# Patient Record
Sex: Female | Born: 1995 | Race: Black or African American | Hispanic: No | Marital: Single | Smoking: Current some day smoker
Health system: Southern US, Community
[De-identification: ages and names within clinical notes are randomized; demographics above are authoritative.]

## PROBLEM LIST (undated history)

## (undated) ENCOUNTER — Ambulatory Visit (HOSPITAL_COMMUNITY): Admission: EM | Payer: Self-pay | Source: Home / Self Care

## (undated) DIAGNOSIS — Z789 Other specified health status: Secondary | ICD-10-CM

## (undated) HISTORY — PX: NO PAST SURGERIES: SHX2092

---
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
## 2013-05-11 ENCOUNTER — Ambulatory Visit (INDEPENDENT_AMBULATORY_CARE_PROVIDER_SITE_OTHER): Payer: PRIVATE HEALTH INSURANCE | Admitting: Advanced Practice Midwife

## 2013-05-11 ENCOUNTER — Encounter: Payer: Self-pay | Admitting: Advanced Practice Midwife

## 2013-05-11 VITALS — BP 111/69 | HR 80 | Temp 98.6°F | Ht 64.0 in | Wt 170.0 lb

## 2013-05-11 DIAGNOSIS — IMO0001 Reserved for inherently not codable concepts without codable children: Secondary | ICD-10-CM

## 2013-05-11 DIAGNOSIS — Z01419 Encounter for gynecological examination (general) (routine) without abnormal findings: Secondary | ICD-10-CM

## 2013-05-11 DIAGNOSIS — Z113 Encounter for screening for infections with a predominantly sexual mode of transmission: Secondary | ICD-10-CM

## 2013-05-11 LAB — RPR

## 2013-05-11 LAB — TSH: TSH: 3.292 u[IU]/mL (ref 0.350–4.500)

## 2013-05-11 MED ORDER — NORELGESTROMIN-ETH ESTRADIOL 150-35 MCG/24HR TD PTWK: 1.0000 | MEDICATED_PATCH | TRANSDERMAL | Status: DC

## 2013-05-11 NOTE — Progress Notes (Signed)
Patient in office today for an annual exam. Patient states she would like blood work for STD. Patient states period is irregular she never knows when its going to start or stop. Patient states that last month her period lasted for the whole month. Patient would like to talk about birth control.

## 2013-05-11 NOTE — Progress Notes (Signed)
  Subjective:     Stacey Hines is a 18 y.o. female and is here for a comprehensive physical exam. The patient reports problems - abnormal uterine bleeding. Patient reports she has abnormal bleeding, had bleeding all last month. She was on ortho evra for AUB before and would like to go back on this. She denies risk of blood clot, liver disease. Desires STI screening.   Patient currently having heavy uterine bleeding. Female partner.   History   Social History  . Marital Status: Single    Spouse Name: N/A    Number of Children: N/A  . Years of Education: N/A   Occupational History  . Not on file.   Social History Main Topics  . Smoking status: Never Smoker   . Smokeless tobacco: Never Used  . Alcohol Use: No  . Drug Use: No  . Sexual Activity: Yes    Partners: Female   Other Topics Concern  . Not on file   Social History Narrative  . No narrative on file   No health maintenance topics applied.  The following portions of the patient's history were reviewed and updated as appropriate: allergies, current medications, past family history, past medical history, past social history, past surgical history and problem list.  Review of Systems A comprehensive review of systems was negative.   Objective:    BP 111/69  Pulse 80  Temp(Src) 98.6 F (37 C)  Ht 5\' 4"  (1.626 m)  Wt 170 lb (77.111 kg)  BMI 29.17 kg/m2  LMP 05/06/2013 General appearance: alert and cooperative Head: Normocephalic, without obvious abnormality, atraumatic Eyes: conjunctivae/corneas clear. PERRL, EOM's intact. Fundi benign. Nose: Nares normal. Septum midline. Mucosa normal. No drainage or sinus tenderness. Throat: lips, mucosa, and tongue normal; teeth and gums normal Neck: no adenopathy, no carotid bruit, no JVD, supple, symmetrical, trachea midline and thyroid not enlarged, symmetric, no tenderness/mass/nodules Back: symmetric, no curvature. ROM normal. No CVA tenderness. Lungs: clear to  auscultation bilaterally Breasts: normal appearance, no masses or tenderness Heart: regular rate and rhythm, S1, S2 normal, no murmur, click, rub or gallop Abdomen: soft, non-tender; bowel sounds normal; no masses,  no organomegaly Extremities: extremities normal, atraumatic, no cyanosis or edema Lymph nodes: Cervical, supraclavicular, and axillary nodes normal. Neurologic: Grossly normal    Assessment:    Healthy female exam.  Abnormal Uterine Bleeding Desires STI screening     Plan:     See After Visit Summary for Counseling Recommendations  STI screening today. Urine GC/CT.  Ortho Evra for contraception. Reviewed all options w/ patient, she declined. Reviewed risk and benefits of ortho evra. TSH done and pending  40 min spent with patient greater than 80% spent in counseling and coordination of care.   Eliese Kerwood Wilson SingerWren CNM

## 2013-05-12 LAB — HEPATITIS C ANTIBODY: HCV AB: NEGATIVE

## 2013-05-12 LAB — HEPATITIS B SURFACE ANTIGEN: Hepatitis B Surface Ag: NEGATIVE

## 2013-05-12 LAB — HIV ANTIBODY (ROUTINE TESTING W REFLEX): HIV: NONREACTIVE

## 2013-05-12 LAB — GC/CHLAMYDIA PROBE AMP
CT Probe RNA: NEGATIVE
GC Probe RNA: NEGATIVE

## 2013-05-18 ENCOUNTER — Encounter: Payer: Self-pay | Admitting: Advanced Practice Midwife

## 2013-06-20 ENCOUNTER — Ambulatory Visit: Payer: PRIVATE HEALTH INSURANCE | Admitting: Obstetrics

## 2013-06-22 ENCOUNTER — Ambulatory Visit: Payer: PRIVATE HEALTH INSURANCE | Admitting: Advanced Practice Midwife

## 2013-06-26 ENCOUNTER — Encounter: Payer: Self-pay | Admitting: Obstetrics

## 2013-06-26 ENCOUNTER — Ambulatory Visit (INDEPENDENT_AMBULATORY_CARE_PROVIDER_SITE_OTHER): Payer: PRIVATE HEALTH INSURANCE | Admitting: Obstetrics

## 2013-06-26 VITALS — BP 116/73 | HR 92 | Temp 99.0°F | Ht 64.0 in | Wt 169.0 lb

## 2013-06-26 DIAGNOSIS — Z113 Encounter for screening for infections with a predominantly sexual mode of transmission: Secondary | ICD-10-CM

## 2013-06-26 DIAGNOSIS — Z3009 Encounter for other general counseling and advice on contraception: Secondary | ICD-10-CM

## 2013-06-26 DIAGNOSIS — N760 Acute vaginitis: Secondary | ICD-10-CM

## 2013-06-26 NOTE — Progress Notes (Signed)
Subjective:     Stacey Hines is a [age] y.o. female here for a routine exam.  Current complaints: Patient states she is in office today for a pain in her lower left side that wraps around to her back. Pt states it is a sharp shooting pain that comes and goes. Pt states when she sleeps on her left side or applies pressure it hurts worse. Pt states the pain has been going on for 1 week. Pt states her significant other was diagnosed with PID about a month ago. Pt denies any information regarding her significant other's test results.   Personal health questionnaire reviewed: yes.   Gynecologic History Patient's last menstrual period was 05/11/2013. Contraception: none  Obstetric History OB History  Gravida Para Term Preterm AB SAB TAB Ectopic Multiple Living  0 0 0 0 0 0 0 0 0 0          The following portions of the patient's history were reviewed and updated as appropriate: allergies, current medications, past family history, past medical history, past social history, past surgical history and problem list.  Review of Systems Pertinent items are noted in HPI.    Objective:    General appearance: alert and no distress Abdomen: normal findings: soft, non-tender Pelvic: cervix normal in appearance, external genitalia normal, no adnexal masses or tenderness, no cervical motion tenderness, rectovaginal septum normal, uterus normal size, shape, and consistency and vagina normal without discharge    Assessment:    Healthy female exam.   Counseling for contraception.   Plan:    Education reviewed: safe sex/STD prevention. Contraception: OCP (estrogen/progesterone). Follow up in: several months. Continue PNV's.

## 2013-06-27 ENCOUNTER — Encounter: Payer: Self-pay | Admitting: Obstetrics

## 2013-06-27 DIAGNOSIS — N760 Acute vaginitis: Secondary | ICD-10-CM | POA: Insufficient documentation

## 2013-06-27 LAB — GC/CHLAMYDIA PROBE AMP
CT PROBE, AMP APTIMA: NEGATIVE
GC Probe RNA: NEGATIVE

## 2013-06-27 LAB — WET PREP BY MOLECULAR PROBE
CANDIDA SPECIES: NEGATIVE
Gardnerella vaginalis: NEGATIVE
TRICHOMONAS VAG: NEGATIVE

## 2013-06-29 ENCOUNTER — Encounter: Payer: Self-pay | Admitting: Obstetrics

## 2013-06-29 ENCOUNTER — Ambulatory Visit (INDEPENDENT_AMBULATORY_CARE_PROVIDER_SITE_OTHER): Payer: PRIVATE HEALTH INSURANCE | Admitting: Obstetrics

## 2013-06-29 VITALS — BP 110/74 | HR 80 | Temp 98.0°F | Ht 64.0 in | Wt 169.0 lb

## 2013-06-29 DIAGNOSIS — N739 Female pelvic inflammatory disease, unspecified: Secondary | ICD-10-CM

## 2013-06-29 DIAGNOSIS — Z01419 Encounter for gynecological examination (general) (routine) without abnormal findings: Secondary | ICD-10-CM

## 2013-06-29 NOTE — Progress Notes (Signed)
Subjective:     Stacey Hines is a 18 y.o. female here for a routine exam.  Current complaints: Patient is in the office today for a follow up for PID. Patient denies any concerns.  Personal health questionnaire reviewed: yes.   Gynecologic History Patient's last menstrual period was 05/11/2013. Contraception: none  Obstetric History OB History  Gravida Para Term Preterm AB SAB TAB Ectopic Multiple Living  0 0 0 0 0 0 0 0 0 0          The following portions of the patient's history were reviewed and updated as appropriate: allergies, current medications, past family history, past medical history, past social history, past surgical history and problem list.  Review of Systems Pertinent items are noted in HPI.    Objective:    General appearance: alert and no distress Abdomen: normal findings: soft, non-tender Pelvic: NEFG.  Vagina normal.  Uterus NSSC, NT.  Adnexa negative  Assessment:    Healthy female exam.   ? H/O PID.   Plan:    Annual exam next visit.

## 2013-09-21 ENCOUNTER — Encounter (HOSPITAL_COMMUNITY): Payer: Self-pay | Admitting: Emergency Medicine

## 2013-09-21 ENCOUNTER — Emergency Department (HOSPITAL_COMMUNITY)
Admission: EM | Admit: 2013-09-21 | Discharge: 2013-09-21 | Disposition: A | Discharge status: Home / Self Care | Payer: Medicaid Other | Attending: Emergency Medicine | Admitting: Emergency Medicine

## 2013-09-21 ENCOUNTER — Emergency Department (HOSPITAL_COMMUNITY): Payer: Medicaid Other

## 2013-09-21 DIAGNOSIS — S59919A Unspecified injury of unspecified forearm, initial encounter: Principal | ICD-10-CM

## 2013-09-21 DIAGNOSIS — Y9289 Other specified places as the place of occurrence of the external cause: Secondary | ICD-10-CM | POA: Insufficient documentation

## 2013-09-21 DIAGNOSIS — S6990XA Unspecified injury of unspecified wrist, hand and finger(s), initial encounter: Principal | ICD-10-CM

## 2013-09-21 DIAGNOSIS — S6992XA Unspecified injury of left wrist, hand and finger(s), initial encounter: Secondary | ICD-10-CM

## 2013-09-21 DIAGNOSIS — S59909A Unspecified injury of unspecified elbow, initial encounter: Secondary | ICD-10-CM | POA: Insufficient documentation

## 2013-09-21 DIAGNOSIS — Y9389 Activity, other specified: Secondary | ICD-10-CM | POA: Insufficient documentation

## 2013-09-21 DIAGNOSIS — W010XXA Fall on same level from slipping, tripping and stumbling without subsequent striking against object, initial encounter: Secondary | ICD-10-CM | POA: Insufficient documentation

## 2013-09-21 MED ORDER — TRAMADOL HCL 50 MG PO TABS: 50.0000 mg | ORAL_TABLET | Freq: Four times a day (QID) | ORAL | Status: DC | PRN

## 2013-09-21 NOTE — ED Notes (Signed)
She slipped and fell onto L arm 2 days ago and having L wrist and hand pain since. She applied ace wrap with no relief. Cms intact

## 2013-09-21 NOTE — Discharge Instructions (Signed)
Followup with orthopedics as directed for your injury. Take tramadol as directed as needed for pain.

## 2013-09-21 NOTE — ED Notes (Signed)
Ortho notified

## 2013-09-21 NOTE — ED Notes (Signed)
Registration at bedside.

## 2013-09-21 NOTE — ED Notes (Signed)
Ortho at bedside.

## 2013-09-21 NOTE — Progress Notes (Signed)
Orthopedic Tech Progress Note Patient Details:  Stacey Hines 13-Dec-1995 [id number] APPLIED VELCRO THUMB SPICA SPLINT TO LUE. CAPILLARY REFILL LESS THAN 2 SECONDS BEFORE AND AFTER SPLINTING. Ortho Devices Type of Ortho Device: Thumb spica splint Splint Material: Other (comment) Ortho Device/Splint Location: LUE Ortho Device/Splint Interventions: Application   Lesle ChrisGilliland, Roy L 09/21/2013, 12:12 PM

## 2013-09-21 NOTE — ED Provider Notes (Signed)
CSN: [id number]     Arrival date & time 09/21/13  1046 History  This chart was scribed for non-physician practitioner, Johnnette Gourdobyn Albert, PA-C, working with Rolland PorterMark James, MD, by Jarvis Morganaylor Ferguson, ED Scribe. This patient was seen in room TR07C/TR07C and the patient's care was started at 11:44 AM.    Chief Complaint  Patient presents with  . Arm Injury     The history is provided by the patient. No language interpreter was used.   HPI Comments: Stacey Hines is a 18 y.o. female who presents to the Emergency Department complaining of an injury to her left arm that occurred Wednesday. She states that pain is localized in her left wrist and left hand. She states that pain is currently a "6/10" but with movement the pain increases to a "9/10". Patient states that she injured herself when she was mopping at the floor at her work on Wednesday and she slipped and fell on her left arm. Patient has not taken anything for the pain. She states that the pain does not radiate up her arm. She denies any elbow or shoulder pain.     History reviewed. No pertinent past medical history. History reviewed. No pertinent past surgical history. History reviewed. No pertinent family history. History  Substance Use Topics  . Smoking status: Never Smoker   . Smokeless tobacco: Never Used  . Alcohol Use: Yes     Comment: Ocassional   OB History   Grav Para Term Preterm Abortions TAB SAB Ect Mult Living   0 0 0 0 0 0 0 0 0 0      Review of Systems  Musculoskeletal: Positive for arthralgias (left wrist pain).  A complete 10 system review of systems was obtained and all systems are negative except as noted in the HPI and PMH.       Allergies  Review of patient's allergies indicates no known allergies.  Home Medications   Prior to Admission medications   Medication Sig Start Date End Date Taking? Authorizing Provider  traMADol (ULTRAM) 50 MG tablet Take 1 tablet (50 mg total) by mouth every 6 (six) hours  as needed. 09/21/13   Trevor Maceobyn M Albert, PA-C   Triage Vitals: Pulse 72  Temp(Src) 99 F (37.2 C) (Oral)  Resp 16  Ht 5\' 4"  (1.626 m)  Wt 155 lb (70.308 kg)  BMI 26.59 kg/m2  SpO2 100%  LMP 09/20/2013  Physical Exam  Nursing note and vitals reviewed. Constitutional: She is oriented to person, place, and time. She appears well-developed and well-nourished. No distress.  HENT:  Head: Normocephalic and atraumatic.  Mouth/Throat: Oropharynx is clear and moist.  Eyes: Conjunctivae and EOM are normal.  Neck: Normal range of motion. Neck supple.  Cardiovascular: Normal rate, regular rhythm and normal heart sounds.   Pulmonary/Chest: Effort normal and breath sounds normal. No respiratory distress.  Musculoskeletal: Normal range of motion. She exhibits no edema.  Left wrist: Tender to palpation over anatomical snuff box and 1st metacarpal, no swelling, full ROM, pain noted in all directions. +2 radial pulses   Neurological: She is alert and oriented to person, place, and time. No sensory deficit.  Skin: Skin is warm and dry.  Psychiatric: She has a normal mood and affect. Her behavior is normal.    ED Course  Procedures (including critical care time) SPLINT APPLICATION Date/Time: 11:51 AM Authorized by: Johnnette GourdAlbert, Robyn Consent: Verbal consent obtained. Risks and benefits: risks, benefits and alternatives were discussed Consent given by: patient Splint applied by:  orthopedic technician Location details: left wrist Splint type: thumb spica Supplies used: velcro Post-procedure: The splinted body part was neurovascularly unchanged following the procedure. Patient tolerance: Patient tolerated the procedure well with no immediate complications.     DIAGNOSTIC STUDIES: Oxygen Saturation is 100% on RA, normal by my interpretation.    COORDINATION OF CARE: 11: 06 AM- Will order diagnostic imaging or left hand and wrist. Pt advised of plan for treatment and pt agrees.  11:50 AM- Will  order Ultram injection and splint of left wrist. Pt advised of plan for treatment and pt agrees.     Labs Review Labs Reviewed - No data to display  Imaging Review Dg Wrist Complete Left  09/21/2013   CLINICAL DATA:  Injury.  Fall.  Wrist and hand pain.  EXAM: LEFT WRIST - COMPLETE 3+ VIEW  COMPARISON:  Hand radiographs from 09/21/2013  FINDINGS: There is no evidence of fracture or dislocation. There is no evidence of arthropathy or other focal bone abnormality. Soft tissues are unremarkable.  IMPRESSION: 1. No significant abnormality identified. If the patient's tenderness is in the vicinity of the scaphoid/anatomic snuffbox, then cross-sectional imaging or presumptive treatment for occult scaphoid fracture might be considered.   Electronically Signed   By: Herbie BaltimoreWalt  Liebkemann M.D.   On: 09/21/2013 11:35   Dg Hand Complete Left  09/21/2013   CLINICAL DATA:  Fall.  Left hand injury and pain.  EXAM: LEFT HAND - COMPLETE 3+ VIEW  COMPARISON:  None.  FINDINGS: There is no evidence of fracture or dislocation. There is no evidence of arthropathy or other focal bone abnormality. Soft tissues are unremarkable.  IMPRESSION: Negative.   Electronically Signed   By: Myles RosenthalJohn  Stahl M.D.   On: 09/21/2013 11:33     EKG Interpretation None      MDM   Final diagnoses:  Left wrist injury, initial encounter    Patient presenting with left wrist pain after injury. She is well appearing and in no apparent distress. Vital signs stable. Neurovascularly intact. X-ray without any acute findings, however patient is tender over anatomical snuff box. Will treat as a fracture, patient placed in thumb spica splint. Stable for discharge. F/u with ortho. Return precautions given. Patient states understanding of treatment care plan and is agreeable.   I personally performed the services described in this documentation, which was scribed in my presence. The recorded information has been reviewed and is  accurate.     Trevor MaceRobyn M Albert, PA-C 09/21/13 1211  Trevor Maceobyn M Albert, PA-C 09/21/13 1215

## 2013-09-26 NOTE — ED Provider Notes (Signed)
Medical screening examination/treatment/procedure(s) were performed by non-physician practitioner and as supervising physician I was immediately available for consultation/collaboration.   EKG Interpretation None        Rolland PorterMark Dorothe Elmore, MD 09/26/13 [phone]

## 2013-10-19 ENCOUNTER — Telehealth: Payer: Self-pay | Admitting: *Deleted

## 2013-10-19 NOTE — Telephone Encounter (Signed)
Pt placed call to office stating that pharmacy has changed her birth control patch and she is now having some bleeding problems and a rash. Return call to pt, she was not available.  Mother states that her daughter is having some bleeding with this particular patch that was given and would like to know if it could be changed back to brand.  Mother was advised that we could contact pharmacy to let them know to fill as brand for next patch due.  Advised mother to contact office if any problems filling Rx.  Advised that pt would need a f/u visit after using brand patch if bleeding problems persist.   Call placed to pharmacy to dispense next refill as brand.

## 2013-12-08 ENCOUNTER — Encounter (HOSPITAL_COMMUNITY): Payer: Self-pay | Admitting: Emergency Medicine

## 2013-12-08 DIAGNOSIS — N949 Unspecified condition associated with female genital organs and menstrual cycle: Secondary | ICD-10-CM | POA: Insufficient documentation

## 2013-12-08 DIAGNOSIS — R1031 Right lower quadrant pain: Secondary | ICD-10-CM | POA: Insufficient documentation

## 2013-12-08 DIAGNOSIS — Z3202 Encounter for pregnancy test, result negative: Secondary | ICD-10-CM | POA: Diagnosis not present

## 2013-12-08 LAB — URINALYSIS, ROUTINE W REFLEX MICROSCOPIC
Bilirubin Urine: NEGATIVE
Glucose, UA: NEGATIVE mg/dL
HGB URINE DIPSTICK: NEGATIVE
Ketones, ur: NEGATIVE mg/dL
Nitrite: NEGATIVE
PROTEIN: NEGATIVE mg/dL
SPECIFIC GRAVITY, URINE: 1.025 (ref 1.005–1.030)
UROBILINOGEN UA: 0.2 mg/dL (ref 0.0–1.0)
pH: 6.5 (ref 5.0–8.0)

## 2013-12-08 LAB — URINE MICROSCOPIC-ADD ON

## 2013-12-08 LAB — PREGNANCY, URINE: Preg Test, Ur: NEGATIVE

## 2013-12-08 NOTE — ED Notes (Signed)
Rt abd  Pain fr 2 days no n or v.  lmp aug 1st.   ni vaginal discharge

## 2013-12-09 ENCOUNTER — Emergency Department (HOSPITAL_COMMUNITY): Payer: Medicaid Other

## 2013-12-09 ENCOUNTER — Emergency Department (HOSPITAL_COMMUNITY)
Admission: EM | Admit: 2013-12-09 | Discharge: 2013-12-09 | Disposition: A | Discharge status: Home / Self Care | Payer: Medicaid Other | Attending: Emergency Medicine | Admitting: Emergency Medicine

## 2013-12-09 ENCOUNTER — Emergency Department (HOSPITAL_COMMUNITY)
Admission: EM | Admit: 2013-12-09 | Discharge: 2013-12-09 | Discharge status: Against Medical Advice | Payer: Medicaid Other | Attending: Emergency Medicine | Admitting: Emergency Medicine

## 2013-12-09 DIAGNOSIS — R1031 Right lower quadrant pain: Secondary | ICD-10-CM | POA: Insufficient documentation

## 2013-12-09 DIAGNOSIS — N898 Other specified noninflammatory disorders of vagina: Secondary | ICD-10-CM | POA: Diagnosis not present

## 2013-12-09 DIAGNOSIS — R1030 Lower abdominal pain, unspecified: Secondary | ICD-10-CM

## 2013-12-09 LAB — CBC WITH DIFFERENTIAL/PLATELET
BASOS ABS: 0 10*3/uL (ref 0.0–0.1)
BASOS PCT: 0 % (ref 0–1)
EOS ABS: 0.1 10*3/uL (ref 0.0–0.7)
Eosinophils Relative: 1 % (ref 0–5)
HCT: 39.4 % (ref 36.0–46.0)
Hemoglobin: 12.8 g/dL (ref 12.0–15.0)
Lymphocytes Relative: 35 % (ref 12–46)
Lymphs Abs: 3.1 10*3/uL (ref 0.7–4.0)
MCH: 27.6 pg (ref 26.0–34.0)
MCHC: 32.5 g/dL (ref 30.0–36.0)
MCV: 84.9 fL (ref 78.0–100.0)
Monocytes Absolute: 0.6 10*3/uL (ref 0.1–1.0)
Monocytes Relative: 7 % (ref 3–12)
NEUTROS PCT: 57 % (ref 43–77)
Neutro Abs: 4.9 10*3/uL (ref 1.7–7.7)
PLATELETS: 250 10*3/uL (ref 150–400)
RBC: 4.64 MIL/uL (ref 3.87–5.11)
RDW: 12.5 % (ref 11.5–15.5)
WBC: 8.7 10*3/uL (ref 4.0–10.5)

## 2013-12-09 LAB — COMPREHENSIVE METABOLIC PANEL
ALBUMIN: 3.5 g/dL (ref 3.5–5.2)
ALK PHOS: 95 U/L (ref 39–117)
ALT: 13 U/L (ref 0–35)
ANION GAP: 10 (ref 5–15)
AST: 17 U/L (ref 0–37)
BUN: 10 mg/dL (ref 6–23)
CO2: 26 mEq/L (ref 19–32)
Calcium: 9.1 mg/dL (ref 8.4–10.5)
Chloride: 101 mEq/L (ref 96–112)
Creatinine, Ser: 0.74 mg/dL (ref 0.50–1.10)
GFR calc Af Amer: >90 mL/min (ref >90)
GFR calc non Af Amer: >90 mL/min (ref >90)
Glucose, Bld: 99 mg/dL (ref 70–99)
POTASSIUM: 4 meq/L (ref 3.7–5.3)
SODIUM: 137 meq/L (ref 137–147)
TOTAL PROTEIN: 7.2 g/dL (ref 6.0–8.3)
Total Bilirubin: <0.2 mg/dL — ABNORMAL LOW (ref 0.3–1.2)

## 2013-12-09 LAB — WET PREP, GENITAL
Clue Cells Wet Prep HPF POC: NONE SEEN
Trich, Wet Prep: NONE SEEN
Yeast Wet Prep HPF POC: NONE SEEN

## 2013-12-09 LAB — LIPASE, BLOOD: Lipase: 29 U/L (ref 11–59)

## 2013-12-09 MED ORDER — CEFTRIAXONE SODIUM 250 MG IJ SOLR
250.0000 mg | Freq: Once | INTRAMUSCULAR | Status: AC
  Administered 2013-12-09: 250 mg via INTRAMUSCULAR
  Filled 2013-12-09: qty 250

## 2013-12-09 MED ORDER — LIDOCAINE HCL 2 % IJ SOLN: 2.1000 mL | Freq: Once | INTRAMUSCULAR | Status: DC

## 2013-12-09 MED ORDER — LIDOCAINE HCL (PF) 1 % IJ SOLN
INTRAMUSCULAR | Status: AC
  Filled 2013-12-09: qty 5

## 2013-12-09 MED ORDER — LIDOCAINE HCL (PF) 1 % IJ SOLN
2.1000 mL | Freq: Once | INTRAMUSCULAR | Status: AC
  Administered 2013-12-09: 2.1 mL

## 2013-12-09 MED ORDER — AZITHROMYCIN 250 MG PO TABS
1000.0000 mg | ORAL_TABLET | Freq: Once | ORAL | Status: AC
  Administered 2013-12-09: 1000 mg via ORAL
  Filled 2013-12-09: qty 4

## 2013-12-09 NOTE — ED Notes (Signed)
Pt taken to us.

## 2013-12-09 NOTE — ED Notes (Signed)
Patient transported to Ultrasound 

## 2013-12-09 NOTE — ED Provider Notes (Signed)
CSN: [id number]     Arrival date & time 12/08/13  2321 History   First MD Initiated Contact with Patient 12/09/13 [phone]     Chief Complaint  Patient presents with  . Abdominal Pain     (Consider location/radiation/quality/duration/timing/severity/associated sxs/prior Treatment) HPI Patient presents with 2 days of right lower quadrant abdominal pain. The pain is episodic in nature. No associated with food. She's had no nausea, vomiting or diarrhea. She denies any constipation. Patient denies any vaginal discharge. She has irregular periods with the last one being 8/1. Patient's sexually active and states she sometimes uses to barrier protection. Denies any fevers or chills. She denies any urinary symptoms including dysuria, frequency, hematuria or urgency. No history of abdominal surgeries. History reviewed. No pertinent past medical history. History reviewed. No pertinent past surgical history. No family history on file. History  Substance Use Topics  . Smoking status: Never Smoker   . Smokeless tobacco: Never Used  . Alcohol Use: Yes     Comment: Ocassional   OB History   Grav Para Term Preterm Abortions TAB SAB Ect Mult Living       Review of Systems  Constitutional: Negative for fever and chills.  Respiratory: Negative for shortness of breath.   Cardiovascular: Negative for chest pain.  Gastrointestinal: Positive for abdominal pain. Negative for nausea, vomiting, diarrhea and constipation.  Genitourinary: Positive for pelvic pain. Negative for dysuria, frequency, hematuria, flank pain, vaginal bleeding and vaginal discharge.  Musculoskeletal: Negative for back pain, neck pain and neck stiffness.  Skin: Negative for rash and wound.  Neurological: Negative for dizziness, weakness, light-headedness, numbness and headaches.  All other systems reviewed and are negative.     Allergies  Review of patient's allergies indicates no known allergies.  Home  Medications   Prior to Admission medications   Not on File   BP 119/64  Pulse 55  Temp(Src) 98.6 F (37 C) (Oral)  Resp 20  Ht  (1.651 m)  Wt 161 lb (73.029 kg)  BMI 26.79 kg/m2  SpO2 99%  LMP 11/05/2013 Physical Exam  Nursing note and vitals reviewed. Constitutional: She is oriented to person, place, and time. She appears well-developed and well-nourished. No distress.  HENT:  Head: Normocephalic and atraumatic.  Mouth/Throat: Oropharynx is clear and moist.  Eyes: EOM are normal. Pupils are equal, round, and reactive to light.  Neck: Normal range of motion. Neck supple.  Cardiovascular: Normal rate and regular rhythm.   Pulmonary/Chest: Effort normal and breath sounds normal. No respiratory distress. She has no wheezes. She has no rales.  Abdominal: Soft. Bowel sounds are normal. She exhibits no distension and no mass. There is tenderness (to palpation in the right lower quadrant and right adnexal region. No rebound or guarding). There is no rebound and no guarding.  Musculoskeletal: Normal range of motion. She exhibits no edema and no tenderness.   No CVA tenderness bilaterally.  Neurological: She is alert and oriented to person, place, and time.  Skin: Skin is warm and dry. No rash noted. No erythema.  Psychiatric: She has a normal mood and affect. Her behavior is normal.    ED Course  Procedures (including critical care time) Labs Review Labs Reviewed  COMPREHENSIVE METABOLIC PANEL - Abnormal; Notable for the following:    Total Bilirubin <0.2 (*)    All other components within normal limits  URINALYSIS, ROUTINE W REFLEX MICROSCOPIC - Abnormal; Notable for the following:  APPearance CLOUDY (*)    Leukocytes, UA TRACE (*)    All other components within normal limits  URINE MICROSCOPIC-ADD ON - Abnormal; Notable for the following:    Squamous Epithelial / LPF MANY (*)    Bacteria, UA FEW (*)    All other components within normal limits  WET PREP, GENITAL   GC/CHLAMYDIA PROBE AMP  CBC WITH DIFFERENTIAL  LIPASE, BLOOD  PREGNANCY, URINE  HIV ANTIBODY (ROUTINE TESTING)    Imaging Review No results found.   EKG Interpretation None      MDM   Final diagnoses:  None   Patient left AMA prior to pelvic exam and complete workup.     Loren Racer, MD 12/10/13 [phone]

## 2013-12-09 NOTE — ED Provider Notes (Signed)
CSN: [id number]     Arrival date & time 12/09/13  1705 History   First MD Initiated Contact with Patient 12/09/13 1937     Chief Complaint  Patient presents with  . Abdominal Pain    Patient is a 18 y.o. female presenting with abdominal pain. The history is provided by the patient and medical records.  Abdominal Pain Pain location:  RLQ Pain quality: stabbing   Pain radiation: R back. Pain severity:  Moderate Onset quality:  Gradual Duration:  3 days Timing:  Intermittent Progression since onset: no pain currently. Chronicity:  New Context: recent sexual activity (unprotected)   Context: not alcohol use, not awakening from sleep, not previous surgeries, not sick contacts, not suspicious food intake and not trauma   Worsened by:  Palpation Associated symptoms: no constipation, no diarrhea, no dysuria, no fatigue, no fever, no hematemesis, no hematochezia, no hematuria, no melena, no nausea, no shortness of breath, no vaginal bleeding, no vaginal discharge and no vomiting   Risk factors: no NSAID use and not pregnant     She's had no nausea, vomiting or diarrhea. LMP 8/1. Patient's sexually active and states she sometimes uses to barrier protection.   No past medical history on file. No past surgical history on file. No family history on file. History  Substance Use Topics  . Smoking status: Never Smoker   . Smokeless tobacco: Never Used  . Alcohol Use: Yes     Comment: Ocassional   OB History   Grav Para Term Preterm Abortions TAB SAB Ect Mult Living       Review of Systems  Constitutional: Negative for fever and fatigue.  Respiratory: Negative for shortness of breath.   Gastrointestinal: Positive for abdominal pain. Negative for nausea, vomiting, diarrhea, constipation, blood in stool, melena, hematochezia and hematemesis.  Genitourinary: Negative for dysuria, hematuria, flank pain, vaginal bleeding and vaginal discharge.  Musculoskeletal: Negative  for back pain.    Allergies  Review of patient's allergies indicates no known allergies.  Home Medications   Prior to Admission medications   Not on File   BP 113/67  Pulse 83  Temp(Src) 98.8 F (37.1 C) (Oral)  Resp 14  Ht  (1.651 m)  Wt 169 lb (76.658 kg)  BMI 28.12 kg/m2  SpO2 97%  LMP 11/05/2013 Physical Exam  Nursing note and vitals reviewed. Constitutional: She is oriented to person, place, and time. She appears well-developed and well-nourished. No distress.  HENT:  Head: Normocephalic and atraumatic.  Nose: Nose normal.  Eyes: Conjunctivae are normal.  Neck: Normal range of motion. Neck supple. No tracheal deviation present.  Cardiovascular: Normal rate, regular rhythm and normal heart sounds.   No murmur heard. Pulmonary/Chest: Effort normal and breath sounds normal. No respiratory distress. She has no rales.  Abdominal: Soft. Bowel sounds are normal. She exhibits no distension and no mass. There is tenderness (RLQ/adnexal, not at [facility]).  NEg CVAT, Rovsing, obturator, psoas, heel tap  Genitourinary: There is no rash or lesion on the right labia. There is no rash or lesion on the left labia. Cervix exhibits no motion tenderness, no discharge and no friability. Right adnexum displays no mass, no tenderness and no fullness. Left adnexum displays no mass, no tenderness and no fullness. No erythema or tenderness around the vagina. Vaginal discharge (thick white) found.  Musculoskeletal: Normal range of motion. She exhibits no edema.  Neurological: She is alert and oriented to person,  place, and time.  Skin: Skin is warm and dry. No rash noted.  Psychiatric: She has a normal mood and affect.    ED Course  Procedures (including critical care time) Labs Review Labs Reviewed  WET PREP, GENITAL - Abnormal; Notable for the following:    WBC, Wet Prep HPF POC MANY (*)    All other components within normal limits  GC/CHLAMYDIA PROBE AMP    Imaging Review US  Transvaginal Non-ob  12/09/2013   CLINICAL DATA:  Right abdominal pain  EXAM: TRANSABDOMINAL AND TRANSVAGINAL ULTRASOUND OF PELVIS  DOPPLER ULTRASOUND OF OVARIES  TECHNIQUE: Both transabdominal and transvaginal ultrasound examinations of the pelvis were performed. Transabdominal technique was performed for global imaging of the pelvis including uterus, ovaries, adnexal regions, and pelvic cul-de-sac.  It was necessary to proceed with endovaginal exam following the transabdominal exam to visualize the uterus and right ovary. Color and duplex Doppler ultrasound was utilized to evaluate blood flow to the ovaries.  COMPARISON:  None.  FINDINGS: Uterus  Measurements: 7.6 x 3.4 x 5.2 cm. No fibroids or other mass visualized.  Endometrium  Thickness: 4 mm.  No focal abnormality visualized.  Right ovary  Measurements: 4.1 x 2.4 x 2.2 cm. Visualized transvaginally. Normal appearance/no adnexal mass.  Left ovary  Measurements: 2.9 x 1.8 x 2.7 cm. Visualized transabdominally. Normal appearance/no adnexal mass.  Pulsed Doppler evaluation of both ovaries demonstrates normal low-resistance arterial and venous waveforms.  Other findings  No free fluid.  IMPRESSION: Negative pelvic ultrasound.  No evidence of ovarian torsion.   Electronically Signed   By: Charline Bills M.D.   On: 12/09/2013 21:33   US Pelvis Complete  12/09/2013   CLINICAL DATA:  Right abdominal pain  EXAM: TRANSABDOMINAL AND TRANSVAGINAL ULTRASOUND OF PELVIS  DOPPLER ULTRASOUND OF OVARIES  TECHNIQUE: Both transabdominal and transvaginal ultrasound examinations of the pelvis were performed. Transabdominal technique was performed for global imaging of the pelvis including uterus, ovaries, adnexal regions, and pelvic cul-de-sac.  It was necessary to proceed with endovaginal exam following the transabdominal exam to visualize the uterus and right ovary. Color and duplex Doppler ultrasound was utilized to evaluate blood flow to the ovaries.  COMPARISON:  None.   FINDINGS: Uterus  Measurements: 7.6 x 3.4 x 5.2 cm. No fibroids or other mass visualized.  Endometrium  Thickness: 4 mm.  No focal abnormality visualized.  Right ovary  Measurements: 4.1 x 2.4 x 2.2 cm. Visualized transvaginally. Normal appearance/no adnexal mass.  Left ovary  Measurements: 2.9 x 1.8 x 2.7 cm. Visualized transabdominally. Normal appearance/no adnexal mass.  Pulsed Doppler evaluation of both ovaries demonstrates normal low-resistance arterial and venous waveforms.  Other findings  No free fluid.  IMPRESSION: Negative pelvic ultrasound.  No evidence of ovarian torsion.   Electronically Signed   By: Charline Bills M.D.   On: 12/09/2013 21:33   [place] Art/ven Flow Abd Pelv Doppler  12/09/2013   CLINICAL DATA:  Right abdominal pain  EXAM: TRANSABDOMINAL AND TRANSVAGINAL ULTRASOUND OF PELVIS  DOPPLER ULTRASOUND OF OVARIES  TECHNIQUE: Both transabdominal and transvaginal ultrasound examinations of the pelvis were performed. Transabdominal technique was performed for global imaging of the pelvis including uterus, ovaries, adnexal regions, and pelvic cul-de-sac.  It was necessary to proceed with endovaginal exam following the transabdominal exam to visualize the uterus and right ovary. Color and duplex Doppler ultrasound was utilized to evaluate blood flow to the ovaries.  COMPARISON:  None.  FINDINGS: Uterus  Measurements: 7.6 x 3.4 x 5.2 cm.  No fibroids or other mass visualized.  Endometrium  Thickness: 4 mm.  No focal abnormality visualized.  Right ovary  Measurements: 4.1 x 2.4 x 2.2 cm. Visualized transvaginally. Normal appearance/no adnexal mass.  Left ovary  Measurements: 2.9 x 1.8 x 2.7 cm. Visualized transabdominally. Normal appearance/no adnexal mass.  Pulsed Doppler evaluation of both ovaries demonstrates normal low-resistance arterial and venous waveforms.  Other findings  No free fluid.  IMPRESSION: Negative pelvic ultrasound.  No evidence of ovarian torsion.   Electronically Signed   By:  Charline Bills M.D.   On: 12/09/2013 21:33     EKG Interpretation None      MDM   Final diagnoses:  RLQ abdominal pain   Currently without pain. VSS, afebrile. No appy signs. No signs of systemic toxicity or peritonitis to suggest surgical emergency.  Appears well doubt torsion, no systemic signs, doubt TOA.    Exam not c.w. PID but with thick white discharge at cervix, cannot be certain coming from os.  Screening labs yesterday unremarkable.  CBC, CMP, lipase, UA, urine preg all negative for acute pathology, do not feel repeat labs helpful.     Pelvic ultrasound neg for acute pathology. Will treat ppx for STI, cultures pending. Pt agreeable with plan.    Medications  cefTRIAXone (ROCEPHIN) injection 250 mg (250 mg Intramuscular Given 12/09/13 2219)  azithromycin (ZITHROMAX) tablet 1,000 mg (1,000 mg Oral Given 12/09/13 2220)  lidocaine (PF) (XYLOCAINE) 1 % injection 2.1 mL (2.1 mLs Other Given 12/09/13 2220)   Strict return precautions for worsening pain, intractable vomiting, syncope.    Sofie Rower, MD 12/10/13 1610  Sofie Rower, MD 12/10/13 0010

## 2013-12-09 NOTE — Discharge Instructions (Signed)

## 2013-12-09 NOTE — ED Provider Notes (Signed)
I saw and evaluated the patient, reviewed the resident's note and I agree with the findings and plan.   EKG Interpretation None      Pt is a 18 y.o. F with 3 days of right pelvic pain. No fevers, nausea, vomiting or diarrhea. She has a reported history of multiple sexual partners without using protection. Pelvic ultrasound unremarkable. No TOA, torsion. Pregnancy test negative. Wet prep unremarkable other than many WBCs. Will empirically treat for gonorrhea and Chlamydia. She had normal labs and urine earlier today.  Layla Maw Prescious Hurless, DO 12/09/13 2311

## 2013-12-09 NOTE — ED Notes (Signed)
Pt came to this RN stating that she has to be at work at 4, and that she had to leave. Pt states that she will try to come back tomorrow. Ranae Palms, MD is aware.

## 2013-12-09 NOTE — ED Notes (Signed)
Pelvic cart set up at bedside  

## 2013-12-09 NOTE — ED Notes (Addendum)
She was here last night for abd pain and std screening and she left before she was discharged. She states she couldn't stay any longer but would like to finish her visit today. She states her pain is the same as last night and shes had no changes in her symptoms.

## 2013-12-11 LAB — GC/CHLAMYDIA PROBE AMP
CT PROBE, AMP APTIMA: NEGATIVE
GC PROBE AMP APTIMA: NEGATIVE

## 2013-12-24 ENCOUNTER — Ambulatory Visit (INDEPENDENT_AMBULATORY_CARE_PROVIDER_SITE_OTHER): Payer: PRIVATE HEALTH INSURANCE | Admitting: Obstetrics & Gynecology

## 2013-12-24 DIAGNOSIS — N760 Acute vaginitis: Secondary | ICD-10-CM

## 2013-12-26 ENCOUNTER — Encounter: Payer: Self-pay | Admitting: Obstetrics & Gynecology

## 2013-12-26 NOTE — Progress Notes (Signed)
No show

## 2014-04-01 ENCOUNTER — Encounter: Payer: Self-pay | Admitting: *Deleted

## 2014-04-02 ENCOUNTER — Encounter: Payer: Self-pay | Admitting: Obstetrics & Gynecology

## 2014-10-21 ENCOUNTER — Emergency Department (HOSPITAL_COMMUNITY)
Admission: EM | Admit: 2014-10-21 | Discharge: 2014-10-21 | Disposition: A | Discharge status: Home / Self Care | Payer: Medicaid Other | Attending: Emergency Medicine | Admitting: Emergency Medicine

## 2014-10-21 ENCOUNTER — Encounter (HOSPITAL_COMMUNITY): Payer: Self-pay | Admitting: *Deleted

## 2014-10-21 DIAGNOSIS — Y998 Other external cause status: Secondary | ICD-10-CM | POA: Insufficient documentation

## 2014-10-21 DIAGNOSIS — X58XXXA Exposure to other specified factors, initial encounter: Secondary | ICD-10-CM | POA: Insufficient documentation

## 2014-10-21 DIAGNOSIS — Y9289 Other specified places as the place of occurrence of the external cause: Secondary | ICD-10-CM | POA: Insufficient documentation

## 2014-10-21 DIAGNOSIS — S0500XA Injury of conjunctiva and corneal abrasion without foreign body, unspecified eye, initial encounter: Secondary | ICD-10-CM

## 2014-10-21 DIAGNOSIS — S0502XA Injury of conjunctiva and corneal abrasion without foreign body, left eye, initial encounter: Secondary | ICD-10-CM | POA: Insufficient documentation

## 2014-10-21 DIAGNOSIS — Z720 Tobacco use: Secondary | ICD-10-CM | POA: Insufficient documentation

## 2014-10-21 DIAGNOSIS — S0501XA Injury of conjunctiva and corneal abrasion without foreign body, right eye, initial encounter: Secondary | ICD-10-CM | POA: Insufficient documentation

## 2014-10-21 DIAGNOSIS — Y9389 Activity, other specified: Secondary | ICD-10-CM | POA: Insufficient documentation

## 2014-10-21 MED ORDER — TETRACAINE HCL 0.5 % OP SOLN
2.0000 [drp] | Freq: Once | OPHTHALMIC | Status: AC
  Administered 2014-10-21: 2 [drp] via OPHTHALMIC
  Filled 2014-10-21: qty 2

## 2014-10-21 MED ORDER — HYDROCODONE-ACETAMINOPHEN 5-325 MG PO TABS: 1.0000 | ORAL_TABLET | Freq: Four times a day (QID) | ORAL | Status: DC | PRN

## 2014-10-21 MED ORDER — NEOMYCIN-POLYMYXIN-DEXAMETH 3.5-10000-0.1 OP OINT
TOPICAL_OINTMENT | Freq: Four times a day (QID) | OPHTHALMIC | Status: DC
  Administered 2014-10-21: 1 via OPHTHALMIC
  Filled 2014-10-21: qty 3.5

## 2014-10-21 MED ORDER — FLUORESCEIN SODIUM 1 MG OP STRP
1.0000 | ORAL_STRIP | Freq: Once | OPHTHALMIC | Status: AC
  Administered 2014-10-21: 1 via OPHTHALMIC
  Filled 2014-10-21: qty 1

## 2014-10-21 NOTE — ED Notes (Addendum)
Pt reports both eye started to hurt after taking out contact lens . Contacts were not corrective.

## 2014-10-21 NOTE — Discharge Instructions (Signed)
Take Tylenol or Motrin for pain. Norco for severe pain. Apply the drops given 4 times a day. Follow for that eye doctor if not improving in 2 days.  Corneal Abrasion The cornea is the clear covering at the front and center of the eye. When looking at the colored portion of the eye (iris), you are looking through the cornea. This very thin tissue is made up of many layers. The surface layer is a single layer of cells (corneal epithelium) and is one of the most sensitive tissues in the body. If a scratch or injury causes the corneal epithelium to come off, it is called a corneal abrasion. If the injury extends to the tissues below the epithelium, the condition is called a corneal ulcer. CAUSES   Scratches.  Trauma.  Foreign body in the eye. Some people have recurrences of abrasions in the area of the original injury even after it has healed (recurrent erosion syndrome). Recurrent erosion syndrome generally improves and goes away with time. SYMPTOMS   Eye pain.  Difficulty or inability to keep the injured eye open.  The eye becomes very sensitive to light.  Recurrent erosions tend to happen suddenly, first thing in the morning, usually after waking up and opening the eye. DIAGNOSIS  Your health care provider can diagnose a corneal abrasion during an eye exam. Dye is usually placed in the eye using a drop or a small paper strip moistened by your tears. When the eye is examined with a special light, the abrasion shows up clearly because of the dye. TREATMENT   Small abrasions may be treated with antibiotic drops or ointment alone.  A pressure patch may be put over the eye. If this is done, follow your doctor's instructions for when to remove the patch. Do not drive or use machines while the eye patch is on. Judging distances is hard to do with a patch on. If the abrasion becomes infected and spreads to the deeper tissues of the cornea, a corneal ulcer can result. This is serious because it can  cause corneal scarring. Corneal scars interfere with light passing through the cornea and cause a loss of vision in the involved eye. HOME CARE INSTRUCTIONS  Use medicine or ointment as directed. Only take over-the-counter or prescription medicines for pain, discomfort, or fever as directed by your health care provider.  Do not drive or operate machinery if your eye is patched. Your ability to judge distances is impaired.  If your health care provider has given you a follow-up appointment, it is very important to keep that appointment. Not keeping the appointment could result in a severe eye infection or permanent loss of vision. If there is any problem keeping the appointment, let your health care provider know. SEEK MEDICAL CARE IF:   You have pain, light sensitivity, and a scratchy feeling in one eye or both eyes.  Your pressure patch keeps loosening up, and you can blink your eye under the patch after treatment.  Any kind of discharge develops from the eye after treatment or if the lids stick together in the morning.  You have the same symptoms in the morning as you did with the original abrasion days, weeks, or months after the abrasion healed. MAKE SURE YOU:   Understand these instructions.  Will watch your condition.  Will get help right away if you are not doing well or get worse. Document Released: 03/19/2000 Document Revised: 03/27/2013 Document Reviewed: 11/27/2012 [facility] CenterExitCare Patient Information 2015 [place], [place]. This information  is not intended to replace advice given to you by your health care provider. Make sure you discuss any questions you have with your health care provider.

## 2014-10-21 NOTE — ED Provider Notes (Signed)
CSN: [id number]     Arrival date & time 10/21/14  0712 History   First MD Initiated Contact with Patient 10/21/14 0732     Chief Complaint  Patient presents with  . Eye Pain     (Consider location/radiation/quality/duration/timing/severity/associated sxs/prior Treatment) HPI Stacey Hines is a 19 y.o. female with no medical problems presents to emergency department complaining of bilateral eye pain. Patient states she inserted a cosmetic eye contacts yesterday, states she had to take them out 2 hours later because they were bothering her. She states since then severe eye pain, sensitivity to light. She denies any visual changes. She denies any itching. There is no discharge, just watering. She does not have any eye problems and does not wear corrective contacts or glasses. No treatment prior to coming in.  History reviewed. No pertinent past medical history. History reviewed. No pertinent past surgical history. History reviewed. No pertinent family history. History  Substance Use Topics  . Smoking status: Current Some Day Smoker  . Smokeless tobacco: Never Used  . Alcohol Use: Not on file     Comment: Ocassiona   OB History    Gravida Para Term Preterm AB TAB SAB Ectopic Multiple Living   0 0 0 0 0 0 0 0 0 0      Review of Systems  Constitutional: Negative for fever and chills.  HENT: Negative for congestion.   Eyes: Positive for photophobia and pain. Negative for discharge, itching and visual disturbance.  Neurological: Negative for headaches.      Allergies  Pineapple  Home Medications   Prior to Admission medications   Not on File   BP 122/81 mmHg  Pulse 91  Temp(Src) 97.7 F (36.5 C) (Oral)  Resp 18  Ht 5\' 4"  (1.626 m)  Wt 160 lb (72.576 kg)  BMI 27.45 kg/m2  SpO2 99%  LMP 10/21/2014 Physical Exam  Constitutional: She appears well-developed and well-nourished. No distress.  Eyes: EOM are normal. Pupils are equal, round, and reactive to light.   Bilateral conjunctival injection, bilateral corneal abrasions on exam and fluorescein stain.  Neck: Neck supple.  Neurological: She is alert.  Skin: Skin is warm and dry.  Nursing note and vitals reviewed.   ED Course  Procedures (including critical care time) Labs Review Labs Reviewed - No data to display  Imaging Review No results found.   EKG Interpretation None      MDM   Final diagnoses:  Corneal abrasion, unspecified laterality, initial encounter    Bilateral conjunctival abrasions. Visual acuity 20/30 bilaterally. Will start on maxitrol. Follow up with ophthalmology.   Filed Vitals:   10/21/14 0718 10/21/14 0720  BP: 122/81   Pulse: 91   Temp: 97.7 F (36.5 C)   TempSrc: Oral   Resp: 18   Height:  5\' 4"  (1.626 m)  Weight:  160 lb (72.576 kg)  SpO2: 99%       Jaynie Crumbleatyana Zaiyden Strozier, PA-C 10/21/14 1456  Jaynie Crumbleatyana Arieon Corcoran, PA-C 10/21/14 1456  Mancel BaleElliott Wentz, MD 10/21/14 1629

## 2015-03-20 ENCOUNTER — Inpatient Hospital Stay (HOSPITAL_COMMUNITY)
Admission: AD | Admit: 2015-03-20 | Discharge: 2015-03-20 | Disposition: A | Discharge status: Home / Self Care | Payer: Self-pay | Source: Ambulatory Visit | Attending: Family Medicine | Admitting: Family Medicine

## 2015-03-20 ENCOUNTER — Encounter (HOSPITAL_COMMUNITY): Payer: Self-pay | Admitting: *Deleted

## 2015-03-20 DIAGNOSIS — R109 Unspecified abdominal pain: Secondary | ICD-10-CM | POA: Insufficient documentation

## 2015-03-20 DIAGNOSIS — N939 Abnormal uterine and vaginal bleeding, unspecified: Secondary | ICD-10-CM | POA: Insufficient documentation

## 2015-03-20 DIAGNOSIS — F17200 Nicotine dependence, unspecified, uncomplicated: Secondary | ICD-10-CM | POA: Insufficient documentation

## 2015-03-20 LAB — CBC WITH DIFFERENTIAL/PLATELET
Basophils Absolute: 0 10*3/uL (ref 0.0–0.1)
Basophils Relative: 0 %
Eosinophils Absolute: 0.2 10*3/uL (ref 0.0–0.7)
Eosinophils Relative: 2 %
HEMATOCRIT: 36 % (ref 36.0–46.0)
Hemoglobin: 11.7 g/dL — ABNORMAL LOW (ref 12.0–15.0)
LYMPHS PCT: 41 %
Lymphs Abs: 3.8 10*3/uL (ref 0.7–4.0)
MCH: 27.5 pg (ref 26.0–34.0)
MCHC: 32.5 g/dL (ref 30.0–36.0)
MCV: 84.7 fL (ref 78.0–100.0)
MONO ABS: 0.8 10*3/uL (ref 0.1–1.0)
MONOS PCT: 9 %
Neutro Abs: 4.5 10*3/uL (ref 1.7–7.7)
Neutrophils Relative %: 48 %
PLATELETS: 247 10*3/uL (ref 150–400)
RBC: 4.25 MIL/uL (ref 3.87–5.11)
RDW: 14.6 % (ref 11.5–15.5)
WBC: 9.3 10*3/uL (ref 4.0–10.5)

## 2015-03-20 LAB — WET PREP, GENITAL
CLUE CELLS WET PREP: NONE SEEN
SPERM: NONE SEEN
TRICH WET PREP: NONE SEEN
YEAST WET PREP: NONE SEEN

## 2015-03-20 LAB — URINALYSIS, ROUTINE W REFLEX MICROSCOPIC
Bilirubin Urine: NEGATIVE
GLUCOSE, UA: NEGATIVE mg/dL
Ketones, ur: NEGATIVE mg/dL
LEUKOCYTES UA: NEGATIVE
Nitrite: NEGATIVE
PH: 6.5 (ref 5.0–8.0)
Protein, ur: NEGATIVE mg/dL
SPECIFIC GRAVITY, URINE: 1.025 (ref 1.005–1.030)

## 2015-03-20 LAB — TYPE AND SCREEN
ABO/RH(D): O POS
Antibody Screen: NEGATIVE

## 2015-03-20 LAB — URINE MICROSCOPIC-ADD ON: BACTERIA UA: NONE SEEN

## 2015-03-20 LAB — POCT PREGNANCY, URINE: Preg Test, Ur: NEGATIVE

## 2015-03-20 LAB — ABO/RH: ABO/RH(D): O POS

## 2015-03-20 MED ORDER — IBUPROFEN 600 MG PO TABS: 600.0000 mg | ORAL_TABLET | Freq: Four times a day (QID) | ORAL | Status: DC | PRN

## 2015-03-20 MED ORDER — MEGESTROL ACETATE 20 MG PO TABS: 40.0000 mg | ORAL_TABLET | Freq: Two times a day (BID) | ORAL | Status: DC

## 2015-03-20 NOTE — MAU Note (Signed)
Still need able to void. Has been on period for over a month and a half.  Started around the 7th of Nov.  Changing every 2 hrs, for the past couple wks.  Never had anything like this before.

## 2015-03-20 NOTE — Discharge Instructions (Signed)
Abnormal Uterine Bleeding Abnormal uterine bleeding means bleeding from the vagina that is not your normal menstrual period. This can be:  Bleeding or spotting between periods.  Bleeding after sex (sexual intercourse).  Bleeding that is heavier or more than normal.  Periods that last longer than usual.  Bleeding after menopause. There are many problems that may cause this. Treatment will depend on the cause of the bleeding. Any kind of bleeding that is not normal should be reviewed by your doctor.  HOME CARE Watch your condition for any changes. These actions may lessen any discomfort you are having:  Do not use tampons or douches as told by your doctor.  Change your pads often. You should get regular pelvic exams and Pap tests. Keep all appointments for tests as told by your doctor. GET HELP IF:  You are bleeding for more than 1 week.  You feel dizzy at times. GET HELP RIGHT AWAY IF:   You pass out.  You have to change pads every 15 to 30 minutes.  You have belly pain.  You have a fever.  You become sweaty or weak.  You are passing large blood clots from the vagina.  You feel sick to your stomach (nauseous) and throw up (vomit). MAKE SURE YOU:  Understand these instructions.  Will watch your condition.  Will get help right away if you are not doing well or get worse.   This information is not intended to replace advice given to you by your health care provider. Make sure you discuss any questions you have with your health care provider.   Document Released: 01/17/2009 Document Revised: 03/27/2013 Document Reviewed: 10/19/2012 Elsevier Interactive Patient Education [employer]2016 Elsevier Inc. Contraception Choices Birth control (contraception) is the use of any methods or devices to stop pregnancy from happening. Below are some methods to help avoid pregnancy. HORMONAL BIRTH CONTROL  A small tube put under the skin of the upper arm (implant). The tube can stay in place  for 3 years. The implant must be taken out after 3 years.  Shots given every 3 months.  Pills taken every day.  Patches that are changed once a week.  A ring put into the vagina (vaginal ring). The ring is left in place for 3 weeks and removed for 1 week. Then, a new ring is put in the vagina.  Emergency birth control pills taken after unprotected sex (intercourse). BARRIER BIRTH CONTROL   A thin covering worn on the penis (female condom) during sex.  A soft, loose covering put into the vagina (female condom) before sex.  A rubber bowl that sits over the cervix (diaphragm). The bowl must be made for you. The bowl is put into the vagina before sex. The bowl is left in place for 6 to 8 hours after sex.  A small, soft cup that fits over the cervix (cervical cap). The cup must be made for you. The cup can be left in place for 48 hours after sex.  A sponge that is put into the vagina before sex.  A chemical that kills or stops sperm from getting into the cervix and uterus (spermicide). The chemical may be a cream, jelly, foam, or pill. INTRAUTERINE (IUD) BIRTH CONTROL   IUD birth control is a small, T-shaped piece of plastic. The plastic is put inside the uterus. There are 2 types of IUD:  Copper IUD. The IUD is covered in copper wire. The copper makes a fluid that kills sperm. It can stay in  place for 10 years. °¨ Hormone IUD. The hormone stops pregnancy from happening. It can stay in place for 5 years. °PERMANENT METHODS °· When the woman has her fallopian tubes sealed, tied, or blocked during surgery. This stops the egg from traveling to the uterus. °· The doctor places a small coil or insert into each fallopian tube. This causes scar tissue to form and blocks the fallopian tubes. °· When the female has the tubes that carry sperm tied off (vasectomy). °NATURAL FAMILY PLANNING BIRTH CONTROL  °· Natural family planning means not having sex or using barrier birth control on the days the woman  could become pregnant. °· Use a calendar to keep track of the length of each period and know the days she can get pregnant. °· Avoid sex during ovulation. °· Use a thermometer to measure body temperature. Also watch for symptoms of ovulation. °· Time sex to be after the woman has ovulated. °Use condoms to help protect yourself against sexually transmitted infections (STIs). Do this no matter what type of birth control you use. Talk to your doctor about which type of birth control is best for you. °  °This information is not intended to replace advice given to you by your health care provider. Make sure you discuss any questions you have with your health care provider. °  °Document Released: 01/17/2009 Document Revised: 03/27/2013 Document Reviewed: 10/11/2012 °Elsevier Interactive Patient Education ©2016 Elsevier Inc. ° °

## 2015-03-20 NOTE — MAU Provider Note (Signed)
History     CSN: [id number]  Arrival date and time: 03/20/15 1429   First Provider Initiated Contact with Patient 03/20/15 1631      Chief Complaint  Patient presents with  . Vaginal Bleeding   HPI Ms. Stacey Hines is a 19 y.o. G0P0000 who presents to MAU today with complaint of vaginal bleeding x 6 weeks. She states that she has a history of irregular periods but has never had one last this long. She was on Ortho Evra at one point to help control her cycles, but didn't feel like it helped much. She states mild lower abdominal cramping associated with the bleeding. She denies recent sexual activity. She denies feeling weak, dizzy or fatigued.   OB History    Gravida Para Term Preterm AB TAB SAB Ectopic Multiple Living        History reviewed. No pertinent past medical history.  History reviewed. No pertinent past surgical history.  History reviewed. No pertinent family history.  Social History  Substance Use Topics  . Smoking status: Current Some Day Smoker  . Smokeless tobacco: Never Used  . Alcohol Use: None     Comment: Ocassiona    Allergies:  Allergies  Allergen Reactions  . Pineapple Swelling    Mouth swells    No prescriptions prior to admission    Review of Systems  Constitutional: Negative for fever and malaise/fatigue.  Gastrointestinal: Positive for abdominal pain.  Genitourinary:       + vaginal bleding  Neurological: Negative for dizziness, loss of consciousness and weakness.   Physical Exam   Blood pressure 112/74, pulse 87, temperature 98.1 F (36.7 C), temperature source Oral, resp. rate 18, last menstrual period 02/10/2015, SpO2 99 %.  Physical Exam  Nursing note and vitals reviewed. Constitutional: She is oriented to person, place, and time. She appears well-developed and well-nourished. No distress.  HENT:  Head: Normocephalic and atraumatic.  Cardiovascular: Normal rate.   Respiratory: Effort normal.   GI: Soft. She exhibits no distension and no mass. There is no tenderness. There is no rebound and no guarding.  Genitourinary: Uterus is tender (mild). Uterus is not enlarged. Cervix exhibits no motion tenderness, no discharge and no friability. Right adnexum displays no mass and no tenderness. Left adnexum displays no mass and no tenderness. There is bleeding (small amount of blood noted in the vaginal vault) in the vagina. No vaginal discharge found.  Neurological: She is alert and oriented to person, place, and time.  Skin: Skin is warm and dry. No erythema.  Psychiatric: She has a normal mood and affect.    Results for orders placed or performed during the hospital encounter of 03/20/15 (from the past 24 hour(s))  CBC with Differential/Platelet     Status: Abnormal   Collection Time: 03/20/15  3:10 PM  Result Value Ref Range   WBC 9.3 4.0 - 10.5 K/uL   RBC 4.25 3.87 - 5.11 MIL/uL   Hemoglobin 11.7 (L) 12.0 - 15.0 g/dL   HCT 29.5 62.1 - 30.8 %   MCV 84.7 78.0 - 100.0 fL   MCH 27.5 26.0 - 34.0 pg   MCHC 32.5 30.0 - 36.0 g/dL   RDW 65.7 84.6 - 96.2 %   Platelets 247 150 - 400 K/uL   Neutrophils Relative % 48 %   Neutro Abs 4.5 1.7 - 7.7 K/uL   Lymphocytes Relative 41 %   Lymphs Abs 3.8 0.7 -  4.0 K/uL   Monocytes Relative 9 %   Monocytes Absolute 0.8 0.1 - 1.0 K/uL   Eosinophils Relative 2 %   Eosinophils Absolute 0.2 0.0 - 0.7 K/uL   Basophils Relative 0 %   Basophils Absolute 0.0 0.0 - 0.1 K/uL  Type and screen [facility]     Status: None   Collection Time: 03/20/15  3:10 PM  Result Value Ref Range   ABO/RH(D) O POS    Antibody Screen NEG    Sample Expiration 03/23/2015   Pregnancy, urine POC     Status: None   Collection Time: 03/20/15  4:37 PM  Result Value Ref Range   Preg Test, Ur NEGATIVE NEGATIVE  Urinalysis, Routine w reflex microscopic ([facility])     Status: Abnormal   Collection Time: 03/20/15  4:40 PM  Result Value Ref Range   Color,  Urine AMBER (A) YELLOW   APPearance CLOUDY (A) CLEAR   Specific Gravity, Urine 1.025 1.005 - 1.030   pH 6.5 5.0 - 8.0   Glucose, UA NEGATIVE NEGATIVE mg/dL   Hgb urine dipstick LARGE (A) NEGATIVE   Bilirubin Urine NEGATIVE NEGATIVE   Ketones, ur NEGATIVE NEGATIVE mg/dL   Protein, ur NEGATIVE NEGATIVE mg/dL   Nitrite NEGATIVE NEGATIVE   Leukocytes, UA NEGATIVE NEGATIVE  Urine microscopic-add on     Status: Abnormal   Collection Time: 03/20/15  4:40 PM  Result Value Ref Range   Squamous Epithelial / LPF 0-5 (A) NONE SEEN   WBC, UA 0-5 0 - 5 WBC/hpf   RBC / HPF TOO NUMEROUS TO COUNT 0 - 5 RBC/hpf   Bacteria, UA NONE SEEN NONE SEEN   Urine-Other MUCOUS PRESENT   Wet prep, genital     Status: Abnormal   Collection Time: 03/20/15  4:41 PM  Result Value Ref Range   Yeast Wet Prep HPF POC NONE SEEN NONE SEEN   Trich, Wet Prep NONE SEEN NONE SEEN   Clue Cells Wet Prep HPF POC NONE SEEN NONE SEEN   WBC, Wet Prep HPF POC FEW (A) NONE SEEN   Sperm NONE SEEN     MAU Course  Procedures None  MDM UPT  UA, CBC and type & screen obtained Patient is hemodynamically stable today. Will discharge for completion of AUB work-up as outpatient.  Assessment and Plan  A: Abnormal uterine bleeding  P: Discharge home Rx for Megace sent to patient's pharmacy Bleeding precautions discussed Outpatient US ordered. They will call patient with an appointment date/time.  [facility] They will call patient with an appointment date/time.  Patient may return to MAU as needed or if her condition were to change or worsen   Marny LowensteinJulie N Geriann Lafont, PA-C  03/20/2015, 5:29 PM

## 2015-03-21 LAB — GC/CHLAMYDIA PROBE AMP (~~LOC~~) NOT AT ARMC
Chlamydia: POSITIVE — AB
Neisseria Gonorrhea: NEGATIVE

## 2015-03-24 ENCOUNTER — Telehealth (HOSPITAL_COMMUNITY): Payer: Self-pay | Admitting: *Deleted

## 2015-03-24 DIAGNOSIS — A749 Chlamydial infection, unspecified: Secondary | ICD-10-CM

## 2015-03-24 MED ORDER — AZITHROMYCIN 500 MG PO TABS: ORAL_TABLET | ORAL | Status: DC

## 2015-03-24 NOTE — Telephone Encounter (Signed)
Telephone call to patient regarding positive chlamydia culture, patient notified.  Patient has not been treated.  Rx routed to patient's pharmacy.  Instructed patient to notify her partner for treatment and to abstain from sex for seven days post treatment.  Report of treatment faxed to health department.

## 2015-04-01 ENCOUNTER — Ambulatory Visit (HOSPITAL_COMMUNITY)
Admission: RE | Admit: 2015-04-01 | Discharge: 2015-04-01 | Disposition: A | Discharge status: Home / Self Care | Payer: Self-pay | Source: Ambulatory Visit | Attending: Medical | Admitting: Medical

## 2015-04-01 DIAGNOSIS — N939 Abnormal uterine and vaginal bleeding, unspecified: Secondary | ICD-10-CM | POA: Insufficient documentation

## 2015-04-02 ENCOUNTER — Telehealth: Payer: Self-pay

## 2015-04-02 DIAGNOSIS — B379 Candidiasis, unspecified: Secondary | ICD-10-CM

## 2015-04-02 NOTE — Telephone Encounter (Signed)
Left message for patient to call office concerning lab results. 

## 2015-04-02 NOTE — Telephone Encounter (Signed)
-----   Message from Harlene SaltsSusan N Ross sent at 04/02/2015 11:08 AM EST ----- Regarding: FW: Needs rx for yeast and question about medicine Can someone please call in this rx for this patient please per Raynelle FanningJulie, thanks guys :) I will call patient to inform her.   ----- Message -----    From: Marny LowensteinJulie N Wenzel, PA-C    Sent: 04/01/2015   5:29 PM      To: Harlene SaltsSusan N Ross Subject: RE: Needs rx for yeast and question about me#  Darl PikesSusan,   Thank you for letting me know about the patient's questions. I am out of town until next week, can you please advise her to continue the megace until her appointment. Also, can you please have one of the RNs send her in Rx for Diflucan per protocol. I don't think I can do it from my phone and won't be able to access a computer until next week.   Thank you!   Raynelle FanningJulie ----- Message -----    From: Harlene SaltsSusan N Ross    Sent: 04/01/2015  11:00 AM      To: Marny LowensteinJulie N Wenzel, PA-C Subject: Needs rx for yeast and question about medici#  Hi Raynelle FanningJulie,  So not sure if you could check on this but this patient came in today wanted to know how long should she take the megace she was prescribed. She has an appointment next week. Also she has developed a yeast infection due to all the meds she is taking, can you please call her in an antibiotic for yeast? Her pharmacy is the same we have in system. Let me know about the medicine and I can call her with the advice, thank you :)

## 2015-04-02 NOTE — Telephone Encounter (Signed)
-----   Message from Susan N Ross sent at 04/02/2015 11:08 AM EST ----- Regarding: FW: Needs rx for yeast and question about medicine Can someone please call in this rx for this patient please per Julie, thanks guys :) I will call patient to inform her.   ----- Message -----    From: Julie N Wenzel, PA-C    Sent: 04/01/2015   5:29 PM      To: Susan N Ross Subject: RE: Needs rx for yeast and question about me#  Susan,   Thank you for letting me know about the patient's questions. I am out of town until next week, can you please advise her to continue the megace until her appointment. Also, can you please have one of the RNs send her in Rx for Diflucan per protocol. I don't think I can do it from my phone and won't be able to access a computer until next week.   Thank you!   Julie ----- Message -----    From: Susan N Ross    Sent: 04/01/2015  11:00 AM      To: Julie N Wenzel, PA-C Subject: Needs rx for yeast and question about medici#  Hi Julie,  So not sure if you could check on this but this patient came in today wanted to know how long should she take the megace she was prescribed. She has an appointment next week. Also she has developed a yeast infection due to all the meds she is taking, can you please call her in an antibiotic for yeast? Her pharmacy is the same we have in system. Let me know about the medicine and I can call her with the advice, thank you :)  

## 2015-04-02 NOTE — Telephone Encounter (Signed)
Left message for patient to call back regarding labs results.

## 2015-04-07 MED ORDER — FLUCONAZOLE 150 MG PO TABS: 150.0000 mg | ORAL_TABLET | Freq: Once | ORAL | Status: DC

## 2015-04-07 NOTE — Telephone Encounter (Signed)
Called patient at both numbers, no answer- left messages on each to call us back for results. Will send in Rx for diflucan

## 2015-04-07 NOTE — Addendum Note (Signed)
Addended by: Kathee DeltonHILLMAN, CARRIE L on: 04/07/2015 04:43 PM   Modules accepted: Orders

## 2015-04-08 ENCOUNTER — Telehealth: Payer: Self-pay

## 2015-04-08 NOTE — Telephone Encounter (Signed)
Pt called and stated that she is returning our call concerning test results.

## 2015-04-09 ENCOUNTER — Encounter: Payer: Self-pay | Admitting: Obstetrics and Gynecology

## 2015-04-09 NOTE — Telephone Encounter (Signed)
Spoke with patient regarding test results.

## 2015-04-14 ENCOUNTER — Telehealth: Payer: Self-pay | Admitting: *Deleted

## 2015-04-14 ENCOUNTER — Encounter: Payer: Self-pay | Admitting: Obstetrics & Gynecology

## 2015-04-14 NOTE — Telephone Encounter (Signed)
Patient called and left message that she is returning our call. Called patient and told her that it doesn't look like we were trying to reach her with additional results but that the call could have been related to her appt that was for today. Patient verbalized understanding & states that she has already got it rescheduled. Patient had no other questions

## 2015-04-14 NOTE — Telephone Encounter (Signed)
Left message to call back  

## 2015-04-14 NOTE — Telephone Encounter (Signed)
Pt left message requesting a call back ?

## 2015-05-02 ENCOUNTER — Encounter: Payer: Self-pay | Admitting: Obstetrics and Gynecology

## 2015-05-14 ENCOUNTER — Encounter: Payer: Self-pay | Admitting: Obstetrics and Gynecology

## 2015-07-25 ENCOUNTER — Emergency Department (HOSPITAL_COMMUNITY)
Admission: EM | Admit: 2015-07-25 | Discharge: 2015-07-25 | Disposition: A | Discharge status: Home / Self Care | Payer: Self-pay | Attending: Emergency Medicine | Admitting: Emergency Medicine

## 2015-07-25 ENCOUNTER — Encounter (HOSPITAL_COMMUNITY): Payer: Self-pay

## 2015-07-25 DIAGNOSIS — Z79899 Other long term (current) drug therapy: Secondary | ICD-10-CM | POA: Insufficient documentation

## 2015-07-25 DIAGNOSIS — N949 Unspecified condition associated with female genital organs and menstrual cycle: Secondary | ICD-10-CM

## 2015-07-25 DIAGNOSIS — Z8619 Personal history of other infectious and parasitic diseases: Secondary | ICD-10-CM | POA: Insufficient documentation

## 2015-07-25 DIAGNOSIS — F17210 Nicotine dependence, cigarettes, uncomplicated: Secondary | ICD-10-CM | POA: Insufficient documentation

## 2015-07-25 DIAGNOSIS — Z3202 Encounter for pregnancy test, result negative: Secondary | ICD-10-CM | POA: Insufficient documentation

## 2015-07-25 DIAGNOSIS — N9489 Other specified conditions associated with female genital organs and menstrual cycle: Secondary | ICD-10-CM | POA: Insufficient documentation

## 2015-07-25 DIAGNOSIS — N939 Abnormal uterine and vaginal bleeding, unspecified: Secondary | ICD-10-CM | POA: Insufficient documentation

## 2015-07-25 LAB — WET PREP, GENITAL
SPERM: NONE SEEN
TRICH WET PREP: NONE SEEN
Yeast Wet Prep HPF POC: NONE SEEN

## 2015-07-25 LAB — CBC
HCT: 41.7 % (ref 36.0–46.0)
Hemoglobin: 13.3 g/dL (ref 12.0–15.0)
MCH: 26.8 pg (ref 26.0–34.0)
MCHC: 31.9 g/dL (ref 30.0–36.0)
MCV: 83.9 fL (ref 78.0–100.0)
PLATELETS: 225 10*3/uL (ref 150–400)
RBC: 4.97 MIL/uL (ref 3.87–5.11)
RDW: 14.9 % (ref 11.5–15.5)
WBC: 8.1 10*3/uL (ref 4.0–10.5)

## 2015-07-25 LAB — I-STAT BETA HCG BLOOD, ED (MC, WL, AP ONLY)

## 2015-07-25 MED ORDER — NAPROXEN 500 MG PO TABS: 500.0000 mg | ORAL_TABLET | Freq: Two times a day (BID) | ORAL | Status: DC

## 2015-07-25 MED ORDER — ACYCLOVIR 400 MG PO TABS: 400.0000 mg | ORAL_TABLET | Freq: Four times a day (QID) | ORAL | Status: DC

## 2015-07-25 NOTE — ED Notes (Signed)
PT. REPORTS HAVING VAGINAL IRRITATION AND BLISTERS . SHE DOES NOT KNOW IF SHE IS ALLERGIC TO THE CONDOMS OR LATEX.   SHE HAS USED CONDOMS IN THE PAST AND NEVER HAS THIS REACTION. SHE ALSO REPORTS HAVING VAGINAL SWELLING AND BLEEDING.

## 2015-07-25 NOTE — Discharge Instructions (Signed)
We have sent the cultures for GC, Chlamydia and herpes. I am giving you information about herpes and starting you on an antiviral medication and medication for pain and inflammation. [facility] or the Health Department.

## 2015-07-25 NOTE — ED Provider Notes (Signed)
CSN: [id number]     Arrival date & time 07/25/15  1219 History   By signing my name below, I, Evon Slack, attest that this documentation has been prepared under the direction and in the presence of [facility] Orlene Och, NP. Electronically Signed: Evon Slack, ED Scribe. 07/25/2015. 6:18 PM.    Chief Complaint  Patient presents with  . Vaginal Bleeding   Patient is a 20 y.o. female presenting with vaginal bleeding. The history is provided by the patient. No language interpreter was used.  Vaginal Bleeding Quality:  Typical of menses Severity:  Moderate Duration:  3 days Chronicity:  New Menstrual history:  Irregular Relieved by:  Nothing Associated symptoms: no abdominal pain   Risk factors: no hx of endometriosis, no gynecological surgery, no new sexual partner and no ovarian cysts    HPI Comments: Stacey Hines is a 20 y.o. female who presents to the Emergency Department complaining of vaginal bleeding onset 3 days prior. Pt reports associated throbbing vaginal pain and swelling. She also reports blistering around the vagina. She states that the bleeding is about the same amount of bleeding when she is on her menstrual cycle. Reports same sexual partner for the last 7 month and uses condoms every time. Pt reports that her partner has recently started using a new condom. Pt denies abdominal pain. Hx of irregular periods. Hx of chlamydia. No birthcontrol for the last year  LMP 07/05/2015.   History reviewed. No pertinent past medical history. History reviewed. No pertinent past surgical history. No family history on file. Social History  Substance Use Topics  . Smoking status: Current Some Day Smoker    Types: Cigarettes  . Smokeless tobacco: Never Used  . Alcohol Use: Yes     Comment: Ocassiona   OB History    Gravida Para Term Preterm AB TAB SAB Ectopic Multiple Living       Review of Systems  Gastrointestinal: Negative for abdominal pain.   Genitourinary: Positive for vaginal bleeding, genital sores and vaginal pain.  All other systems reviewed and are negative.    Allergies  Pineapple  Home Medications   Prior to Admission medications   Medication Sig Start Date End Date Taking? Authorizing Provider  acetaminophen (TYLENOL) 500 MG tablet Take 500 mg by mouth every 6 (six) hours as needed for moderate pain.    Historical Provider, MD  acyclovir (ZOVIRAX) 400 MG tablet Take 1 tablet (400 mg total) by mouth 4 (four) times daily. 07/25/15   Almyra Birman Orlene Och, NP  azithromycin (ZITHROMAX) 500 MG tablet Take two tablets by mouth once. 03/24/15   Peggy Constant, MD  fluconazole (DIFLUCAN) 150 MG tablet Take 1 tablet (150 mg total) by mouth once. 04/07/15   Levie Heritage, DO  ibuprofen (ADVIL,MOTRIN) 600 MG tablet Take 1 tablet (600 mg total) by mouth every 6 (six) hours as needed. 03/20/15   Marny Lowenstein, PA-C  megestrol (MEGACE) 20 MG tablet Take 2 tablets (40 mg total) by mouth 2 (two) times daily. 03/20/15   Marny Lowenstein, PA-C  naproxen (NAPROSYN) 500 MG tablet Take 1 tablet (500 mg total) by mouth 2 (two) times daily with a meal. 07/25/15   [facility]x First Orlene Och, NP   BP 115/82 mmHg  Pulse 102  Temp(Src) 98.1 F (36.7 C) (Oral)  Resp 14  Ht  (1.626 m)  Wt 69.4 kg  BMI 26.25 kg/m2  SpO2 97%  LMP 07/05/2015  Physical Exam  Constitutional: She is oriented to person, place, and time. She appears well-developed and well-nourished. No distress.  HENT:  Head: Normocephalic and atraumatic.  Eyes: Conjunctivae and EOM are normal.  Neck: Neck supple. No tracheal deviation present.  Cardiovascular: Normal rate.   Pulmonary/Chest: Effort normal. No respiratory distress.  Abdominal: Soft. There is no tenderness.  Genitourinary:  External genitalia with multiple vesicular, tender lesions to labia major and labia minor. Small blood vaginal vault. Patient unable to tolerate bimanual exam due to pain.   Musculoskeletal: Normal range  of motion.  Neurological: She is alert and oriented to person, place, and time.  Skin: Skin is warm and dry.  Psychiatric: She has a normal mood and affect. Her behavior is normal.  Nursing note and vitals reviewed.   ED Course  Procedures (including critical care time) DIAGNOSTIC STUDIES: Oxygen Saturation is 99% on RA, normal by my interpretation.    COORDINATION OF CARE: 1:48 PM-Discussed treatment plan with pt at bedside and pt agreed to plan.     Labs Review Labs Reviewed  WET PREP, GENITAL - Abnormal; Notable for the following:    Clue Cells Wet Prep HPF POC PRESENT (*)    WBC, Wet Prep HPF POC MANY (*)    All other components within normal limits  HERPES SIMPLEX VIRUS CULTURE  CBC  RPR  HIV ANTIBODY (ROUTINE TESTING)  I-STAT BETA HCG BLOOD, ED ([facility], [facility], [facility] ONLY)  GC/CHLAMYDIA PROBE AMP ([facility]) NOT AT [facility]    MDM  20 y.o. female with vaginal pain and open lesions and abnormal vaginal bleeding stable for d/c without fever, difficulty voiding and does not appear toxic. 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 Discussed with the patient and all questioned fully answered. She will f/u with the health department or return here if any problems arise.    Final diagnoses:  Genital lesion, female  Abnormal vaginal bleeding    I personally performed the services described in this documentation, which was scribed in my presence. The recorded information has been reviewed and is accurate.     [address]Geovannie Vilar SheldonM Jourdin Connors, NP 07/25/15 1819  Bethann BerkshireJoseph Zammit, MD 07/28/15 [phone]

## 2015-07-26 LAB — RPR: RPR Ser Ql: NONREACTIVE

## 2015-07-26 LAB — HIV ANTIBODY (ROUTINE TESTING W REFLEX): HIV Screen 4th Generation wRfx: NONREACTIVE

## 2015-07-28 LAB — GC/CHLAMYDIA PROBE AMP (~~LOC~~) NOT AT ARMC
CHLAMYDIA, DNA PROBE: NEGATIVE
NEISSERIA GONORRHEA: NEGATIVE

## 2015-07-29 LAB — HERPES SIMPLEX VIRUS CULTURE: CULTURE: DETECTED — AB

## 2015-08-01 ENCOUNTER — Telehealth: Payer: Self-pay | Admitting: *Deleted

## 2015-08-01 NOTE — ED Notes (Signed)
Spoke with patient, verified ID, informed of (+) Herpes Simplex culture, advised to complete Acyclovir 400mg  QID x 12 days given at discharge,  patient informed to notify sexual partners for evaluation and treatment

## 2016-02-10 ENCOUNTER — Encounter (HOSPITAL_COMMUNITY): Payer: Self-pay | Admitting: *Deleted

## 2016-02-10 ENCOUNTER — Emergency Department (HOSPITAL_COMMUNITY)
Admission: EM | Admit: 2016-02-10 | Discharge: 2016-02-10 | Disposition: A | Discharge status: Home / Self Care | Payer: Self-pay | Attending: Emergency Medicine | Admitting: Emergency Medicine

## 2016-02-10 DIAGNOSIS — Z760 Encounter for issue of repeat prescription: Secondary | ICD-10-CM

## 2016-02-10 DIAGNOSIS — Z79899 Other long term (current) drug therapy: Secondary | ICD-10-CM | POA: Insufficient documentation

## 2016-02-10 DIAGNOSIS — J069 Acute upper respiratory infection, unspecified: Secondary | ICD-10-CM

## 2016-02-10 DIAGNOSIS — F17210 Nicotine dependence, cigarettes, uncomplicated: Secondary | ICD-10-CM | POA: Insufficient documentation

## 2016-02-10 MED ORDER — BENZONATATE 100 MG PO CAPS: 200.0000 mg | ORAL_CAPSULE | Freq: Two times a day (BID) | ORAL | 0 refills | Status: DC | PRN

## 2016-02-10 MED ORDER — ACYCLOVIR 400 MG PO TABS: 400.0000 mg | ORAL_TABLET | Freq: Four times a day (QID) | ORAL | 0 refills | Status: DC

## 2016-02-10 NOTE — ED Provider Notes (Signed)
[facility]-EMERGENCY DEPT Provider Note   CSN: [id number] Arrival date & time: 02/10/16  0754     History   Chief Complaint Chief Complaint  Patient presents with  . Fever    HPI Stacey Hines is a 20 y.o. female.  Patient presents to the ED with a chief complaint of cold symptoms.  She reports cough, runny nose, and sore throat for the past 3 days.  She reports subjective fevers and chills.  She denies any productive cough, nausea, or vomiting.  She has not tried taking anything for her symptoms.  She reports other sick contacts at home.  Patient also states that she does not have a PCP and asks for a refill of acyclovir for her herpes.  She denies a current breakout.  She states that she would like to have something on hand just in case, and since she doesn't have a PCP, she doesn't have means of getting a refill.   The history is provided by the patient. No language interpreter was used.    History reviewed. No pertinent past medical history.  Patient Active Problem List   Diagnosis Date Noted  . Vaginitis and vulvovaginitis, unspecified 06/27/2013    History reviewed. No pertinent surgical history.  OB History    Gravida Para Term Preterm AB Living   0 0 0 0 0 0   SAB TAB Ectopic Multiple Live Births   0 0 0 0         Home Medications    Prior to Admission medications   Medication Sig Start Date End Date Taking? Authorizing Provider  acetaminophen (TYLENOL) 500 MG tablet Take 500 mg by mouth every 6 (six) hours as needed for moderate pain.    Historical Provider, MD  acyclovir (ZOVIRAX) 400 MG tablet Take 1 tablet (400 mg total) by mouth 4 (four) times daily. 02/10/16   Roxy Horsemanobert Kharma Sampsel, PA-C  azithromycin (ZITHROMAX) 500 MG tablet Take two tablets by mouth once. 03/24/15   Peggy Constant, MD  benzonatate (TESSALON) 100 MG capsule Take 2 capsules (200 mg total) by mouth 2 (two) times daily as needed for cough. 02/10/16   Roxy Horsemanobert Agness Sibrian, PA-C  fluconazole  (DIFLUCAN) 150 MG tablet Take 1 tablet (150 mg total) by mouth once. 04/07/15   Levie HeritageJacob J Stinson, DO  ibuprofen (ADVIL,MOTRIN) 600 MG tablet Take 1 tablet (600 mg total) by mouth every 6 (six) hours as needed. 03/20/15   Marny LowensteinJulie N Wenzel, PA-C  megestrol (MEGACE) 20 MG tablet Take 2 tablets (40 mg total) by mouth 2 (two) times daily. 03/20/15   Marny LowensteinJulie N Wenzel, PA-C  naproxen (NAPROSYN) 500 MG tablet Take 1 tablet (500 mg total) by mouth 2 (two) times daily with a meal. 07/25/15   Hope Orlene OchM Neese, NP    Family History No family history on file.  Social History Social History  Substance Use Topics  . Smoking status: Current Some Day Smoker    Types: Cigarettes  . Smokeless tobacco: Never Used  . Alcohol use Yes     Comment: Ocassiona     Allergies   Pineapple   Review of Systems Review of Systems  Constitutional: Positive for chills. Negative for fever.  HENT: Positive for postnasal drip, rhinorrhea, sinus pressure, sneezing and sore throat.   Respiratory: Positive for cough. Negative for shortness of breath.   Cardiovascular: Negative for chest pain.  Gastrointestinal: Negative for abdominal pain, constipation, diarrhea, nausea and vomiting.  Genitourinary: Negative for dysuria.  All other systems reviewed and  are negative.    Physical Exam Updated Vital Signs BP 122/66 (BP Location: Right Arm)   Pulse 79   Temp 97.9 F (36.6 C) (Oral)   Resp 16   LMP 02/10/2016   SpO2 99%   Physical Exam Physical Exam  Constitutional: Pt  is oriented to person, place, and time. Appears well-developed and well-nourished. No distress.  HENT:  Head: Normocephalic and atraumatic.  Right Ear: Tympanic membrane, external ear and ear canal normal.  Left Ear: Tympanic membrane, external ear and ear canal normal.  Nose: Mucosal edema and moderate rhinorrhea present. No epistaxis. Right sinus exhibits no maxillary sinus tenderness and no frontal sinus tenderness. Left sinus exhibits no maxillary  sinus tenderness and no frontal sinus tenderness.  Mouth/Throat: Uvula is midline and mucous membranes are normal. Mucous membranes are not pale and not cyanotic. No oropharyngeal exudate, posterior oropharyngeal edema, posterior oropharyngeal erythema or tonsillar abscesses.  Eyes: Conjunctivae are normal. Pupils are equal, round, and reactive to light.  Neck: Normal range of motion and full passive range of motion without pain.  Cardiovascular: Normal rate and intact distal pulses.   Pulmonary/Chest: Effort normal and breath sounds normal. No stridor.  Clear and equal breath sounds without focal wheezes, rhonchi, rales  Abdominal: Soft. Bowel sounds are normal. There is no tenderness.  Musculoskeletal: Normal range of motion.  Lymphadenopathy:    Pthas no cervical adenopathy.  Neurological: Pt is alert and oriented to person, place, and time.  Skin: Skin is warm and dry. No rash noted. Pt is not diaphoretic.  Psychiatric: Normal mood and affect.  Nursing note and vitals reviewed.    ED Treatments / Results  Labs (all labs ordered are listed, but only abnormal results are displayed) Labs Reviewed - No data to display  EKG  EKG Interpretation None       Radiology No results found.  Procedures Procedures (including critical care time)  Medications Ordered in ED Medications - No data to display   Initial Impression / Assessment and Plan / ED Course  I have reviewed the triage vital signs and the nursing notes.  Pertinent labs & imaging results that were available during my care of the patient were reviewed by me and considered in my medical decision making (see chart for details).  Clinical Course     Patients symptoms are consistent with URI, likely viral etiology. Discussed that antibiotics are not indicated for viral infections. Pt will be discharged with symptomatic treatment.  Verbalizes understanding and is agreeable with plan. Pt is hemodynamically stable & in NAD  prior to dc.  Refill acyclovir as a courtesy refill.   Final Clinical Impressions(s) / ED Diagnoses   Final diagnoses:  Viral upper respiratory tract infection  Medication refill    New Prescriptions Discharge Medication List as of 02/10/2016  8:21 AM    START taking these medications   Details  benzonatate (TESSALON) 100 MG capsule Take 2 capsules (200 mg total) by mouth 2 (two) times daily as needed for cough., Starting Tue 02/10/2016, Print         Roxy Horsemanobert Gleen Ripberger, PA-C 02/10/16 0831    Laurence Spatesachel Morgan Little, MD 02/10/16 [phone]

## 2016-06-10 ENCOUNTER — Encounter (HOSPITAL_COMMUNITY): Payer: Self-pay | Admitting: Emergency Medicine

## 2016-06-10 ENCOUNTER — Emergency Department (HOSPITAL_COMMUNITY)
Admission: EM | Admit: 2016-06-10 | Discharge: 2016-06-10 | Disposition: A | Discharge status: Home / Self Care | Payer: Self-pay | Attending: Emergency Medicine | Admitting: Emergency Medicine

## 2016-06-10 DIAGNOSIS — J029 Acute pharyngitis, unspecified: Secondary | ICD-10-CM | POA: Insufficient documentation

## 2016-06-10 DIAGNOSIS — F17200 Nicotine dependence, unspecified, uncomplicated: Secondary | ICD-10-CM | POA: Insufficient documentation

## 2016-06-10 DIAGNOSIS — Z79899 Other long term (current) drug therapy: Secondary | ICD-10-CM | POA: Insufficient documentation

## 2016-06-10 LAB — RAPID STREP SCREEN (MED CTR MEBANE ONLY): Streptococcus, Group A Screen (Direct): NEGATIVE

## 2016-06-10 NOTE — Discharge Instructions (Signed)
Please use warm saltwater rinses. Take on honey to ease throat. Take Tylenol or ibuprofen as needed for pain. Please follow up with a primary care provider in one week as needed. He will receive a call about 2-5 days if you are strep positive and will be given antibiotics if positive.  Get help right away if: You have a stiff neck. You drool or cannot swallow liquids. You throw up (vomit) or are not able to keep medicine or liquids down. You have very bad pain that does not go away with medicine. You have problems breathing (not from a stuffy nose).

## 2016-06-10 NOTE — ED Triage Notes (Signed)
Pt reports intermittent throat pain x 2 days. Pt noted a white spot on the back of her throat

## 2016-06-10 NOTE — ED Provider Notes (Signed)
[facility]-EMERGENCY DEPT Provider Note   CSN: [id number] Arrival date & time: 06/10/16  1618  By signing my name below, I, Sonum Patel, attest that this documentation has been prepared under the direction and in the presence of [address] SaucierEspina, [place]. Electronically Signed: Sonum Patel, Neurosurgeoncribe. 06/10/16. 6:35 PM.  History   Chief Complaint Chief Complaint  Patient presents with  . Sore Throat    The history is provided by the patient. No language interpreter was used.     HPI Comments: Stacey Hines is a 21 y.o. female who presents to the Emergency Department complaining of gradual onset, constant, gradually worsened sore throat that began 2-3 days ago. She reports an associated mild dry cough. She noticed a white spot to the posterior throat which is what led her here. She states swallowing worsens her pain. She has not tried any OTC medications for her symptoms. She denies nasal congestion, rhinorrhea, fever, chills, nausea, vomiting, diarrhea, difficulty swallowing, SOB.   History reviewed. No pertinent past medical history.  Patient Active Problem List   Diagnosis Date Noted  . Vaginitis and vulvovaginitis, unspecified 06/27/2013    History reviewed. No pertinent surgical history.  OB History    Gravida Para Term Preterm AB Living   0 0 0 0 0 0   SAB TAB Ectopic Multiple Live Births   0 0 0 0         Home Medications    Prior to Admission medications   Medication Sig Start Date End Date Taking? Authorizing Provider  acetaminophen (TYLENOL) 500 MG tablet Take 500 mg by mouth every 6 (six) hours as needed for moderate pain.    Historical Provider, MD  acyclovir (ZOVIRAX) 400 MG tablet Take 1 tablet (400 mg total) by mouth 4 (four) times daily. 02/10/16   Roxy Horsemanobert Browning, PA-C  azithromycin (ZITHROMAX) 500 MG tablet Take two tablets by mouth once. 03/24/15   Peggy Constant, MD  benzonatate (TESSALON) 100 MG capsule Take 2 capsules (200 mg total) by mouth 2 (two)  times daily as needed for cough. 02/10/16   Roxy Horsemanobert Browning, PA-C  fluconazole (DIFLUCAN) 150 MG tablet Take 1 tablet (150 mg total) by mouth once. 04/07/15   Levie HeritageJacob J Stinson, DO  ibuprofen (ADVIL,MOTRIN) 600 MG tablet Take 1 tablet (600 mg total) by mouth every 6 (six) hours as needed. 03/20/15   Marny LowensteinJulie N Wenzel, PA-C  megestrol (MEGACE) 20 MG tablet Take 2 tablets (40 mg total) by mouth 2 (two) times daily. 03/20/15   Marny LowensteinJulie N Wenzel, PA-C  naproxen (NAPROSYN) 500 MG tablet Take 1 tablet (500 mg total) by mouth 2 (two) times daily with a meal. 07/25/15   Hope Orlene OchM Neese, NP    Family History History reviewed. No pertinent family history.  Social History Social History  Substance Use Topics  . Smoking status: Current Some Day Smoker  . Smokeless tobacco: Never Used  . Alcohol use Yes     Comment: Ocassiona     Allergies   Pineapple   Review of Systems Review of Systems  Constitutional: Negative for chills and fever.  HENT: Positive for sore throat. Negative for congestion, ear pain, rhinorrhea and trouble swallowing.   Respiratory: Positive for cough. Negative for shortness of breath.   Gastrointestinal: Negative for nausea and vomiting.     Physical Exam Updated Vital Signs BP 126/81   Pulse 93   Temp 98.8 F (37.1 C)   Resp 16   Wt 70.3 kg   LMP 06/10/2016 (  Exact Date)   SpO2 100%   BMI 26.61 kg/m   Physical Exam  Constitutional: She is oriented to person, place, and time. She appears well-developed and well-nourished.  Well appearing  HENT:  Head: Normocephalic and atraumatic.  Right Ear: External ear normal.  Left Ear: External ear normal.  Nose: Nose normal.  Mouth/Throat: Oropharynx is clear and moist. No oropharyngeal exudate.  Oropharynx without evidence of redness or exudates. Tonsils without evidence of redness, swelling, or exudates. TM's appear normal with no evidence of bulging. EAC appear non erythematous and not swollen  Eyes: EOM are normal. Pupils are  equal, round, and reactive to light.  Neck: Normal range of motion.  Normal ROM. No nuchal rigidity.   Cardiovascular: Normal rate and normal heart sounds.   Pulmonary/Chest: Effort normal and breath sounds normal. No respiratory distress. She has no wheezes. She has no rales.  Lungs CTA. No wheezing. No rales. No stridor. Normal work of breathing  Abdominal: Soft. There is no tenderness. There is no rebound and no guarding.  Soft and nontender. No rebound. No guarding. Negative murphy's sign. No focal tenderness at McBurney's point. No CVA tenderness. No evidence of hernia  Neurological: She is alert and oriented to person, place, and time.  Skin: Skin is warm.  Psychiatric: She has a normal mood and affect. Her behavior is normal.  Nursing note and vitals reviewed.    ED Treatments / Results  DIAGNOSTIC STUDIES: Oxygen Saturation is 100% on RA, normal by my interpretation.    COORDINATION OF CARE: 6:35 PM Discussed treatment plan with pt at bedside and pt agreed to plan.    Labs (all labs ordered are listed, but only abnormal results are displayed) Labs Reviewed  RAPID STREP SCREEN (NOT AT [facility])  CULTURE, GROUP A STREP [facility])    EKG  EKG Interpretation None       Radiology No results found.  Procedures Procedures (including critical care time)  Medications Ordered in ED Medications - No data to display   Initial Impression / Assessment and Plan / ED Course  I have reviewed the triage vital signs and the nursing notes.  Pertinent labs & imaging results that were available during my care of the patient were reviewed by me and considered in my medical decision making (see chart for details).     Pt with negative strep. Diagnosis of viral pharyngitis. No abx indicated at this time. Discussed that results of strep culture are pending and patient will be informed if positive result and abx will be called in at that time. Discharge with symptomatic tx. No evidence of  dehydration. Pt is tolerating secretions. Presentation not concerning for peritonsillar abscess or spread of infection to deep spaces of the throat; patent airway. Specific return precautions discussed. Recommended PCP follow up. Pt appears safe for discharge.   Final Clinical Impressions(s) / ED Diagnoses   Final diagnoses:  Pharyngitis, unspecified etiology    New Prescriptions New Prescriptions   No medications on file   I personally performed the services described in this documentation, which was scribed in my presence. The recorded information has been reviewed and is accurate.   [address] [place], [place] 06/10/16 1841    Raeford Razor, MD 06/22/16 1420

## 2016-06-12 LAB — CULTURE, GROUP A STREP (THRC)

## 2016-08-10 IMAGING — US US TRANSVAGINAL NON-OB
1 series · 15 of 25 positions shown · non-contrast
Comparison: 12/09/2013

CLINICAL DATA: Abnormal uterine bleeding

EXAM:
TRANSABDOMINAL AND TRANSVAGINAL ULTRASOUND OF PELVIS
TECHNIQUE: Both transabdominal and transvaginal ultrasound examinations of the
pelvis were performed. Transabdominal technique was performed for
global imaging of the pelvis including uterus, ovaries, adnexal
regions, and pelvic cul-de-sac. It was necessary to proceed with
endovaginal exam following the transabdominal exam to visualize the
uterus, endometrium, ovaries and adnexa .

[Series 1: us transvaginal non-ob · 15 of 63 slices shown]
[im 1/63]
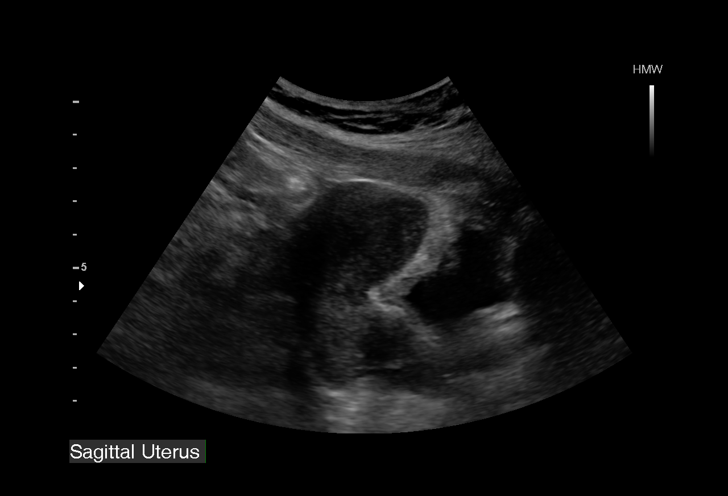
[im 6/63]
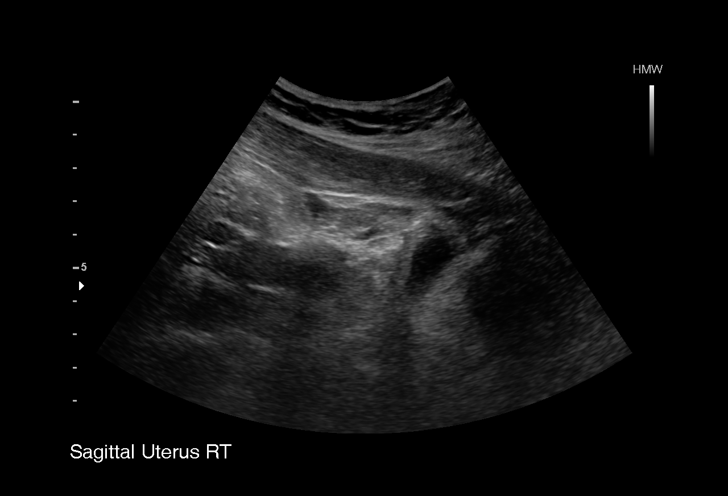
[im 11/63]
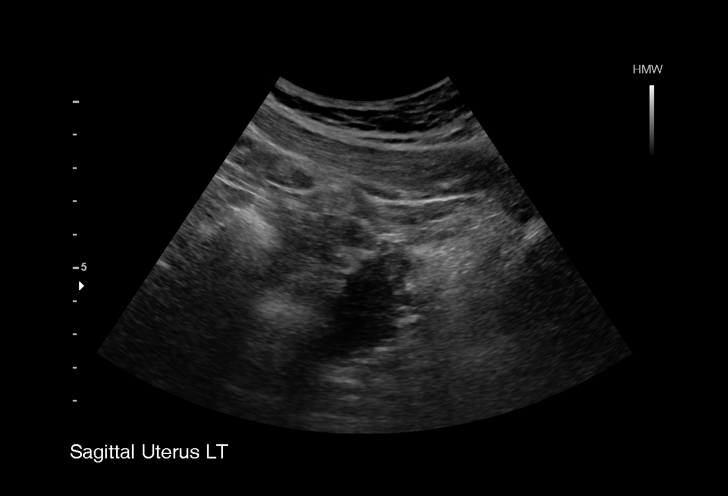
[im 13/63]
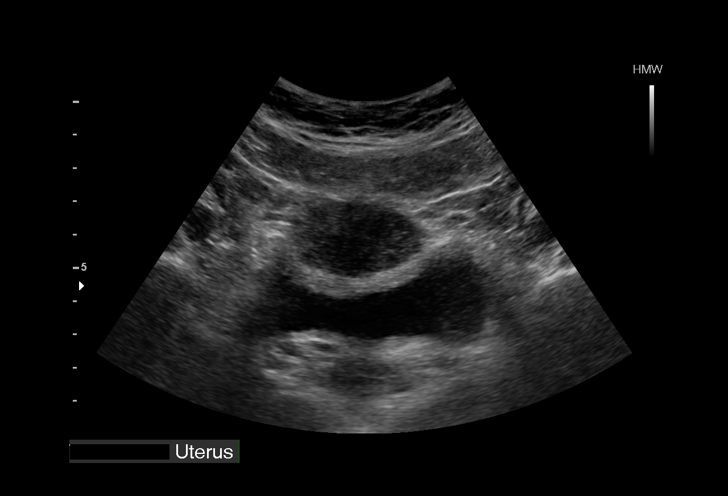
[im 19/63]
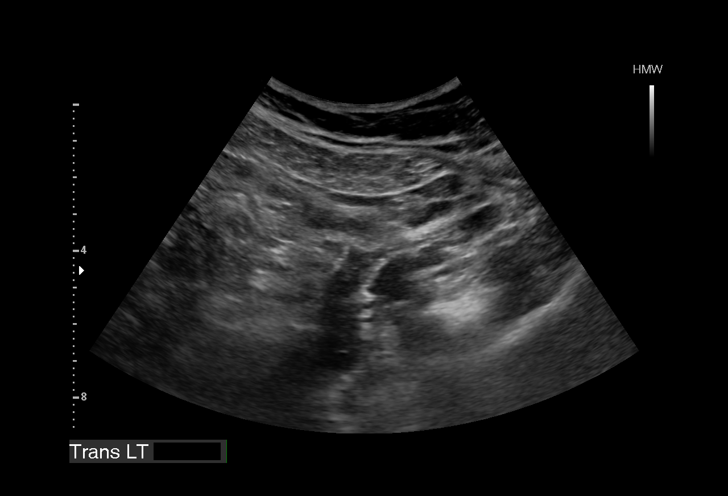
[im 24/63]
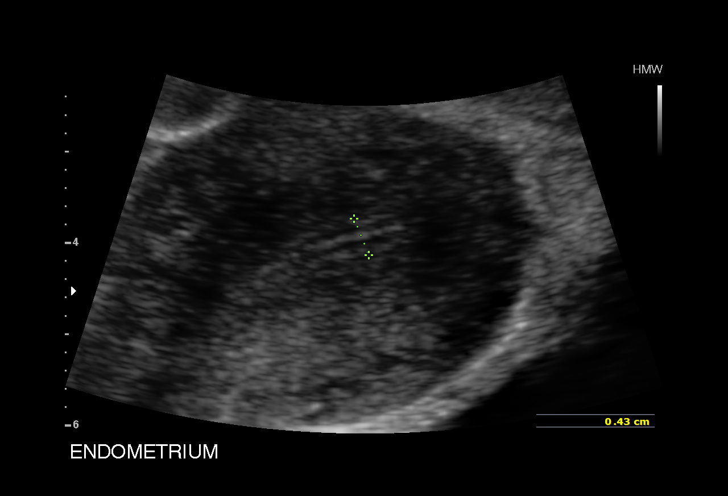
[im 26/63]
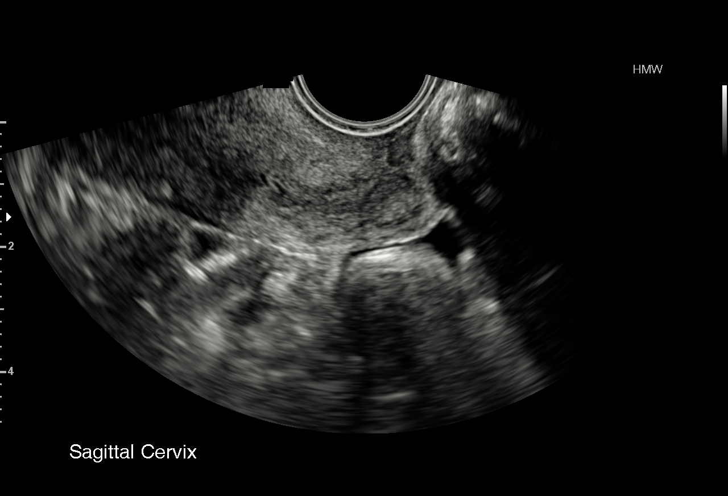
[im 32/63]
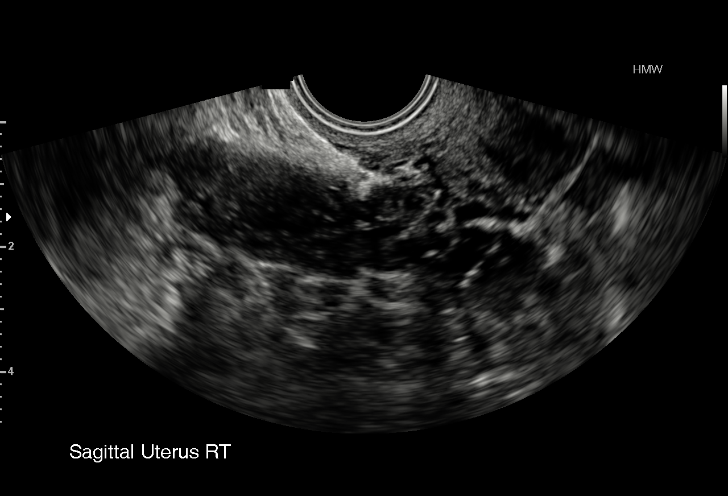
[im 37/63]
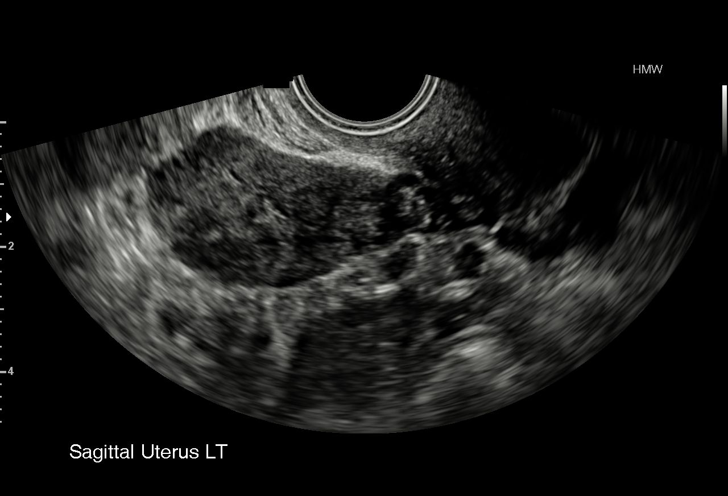
[im 39/63]
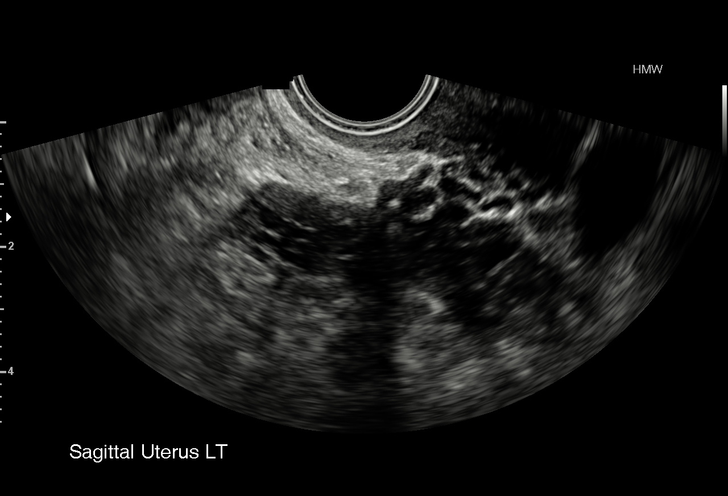
[im 44/63]
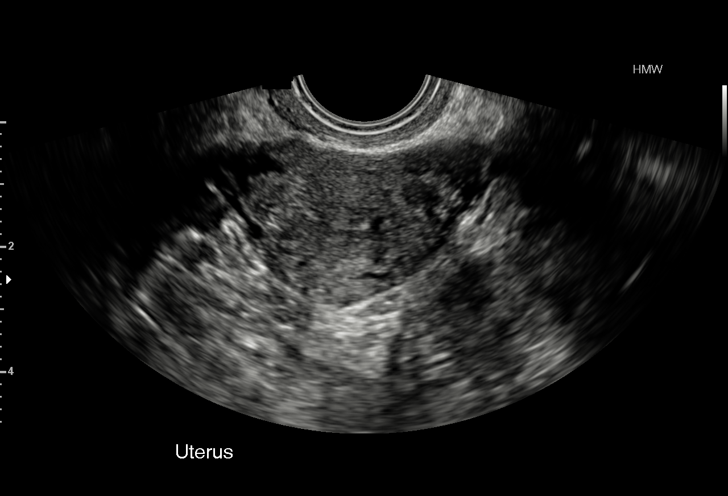
[im 50/63]
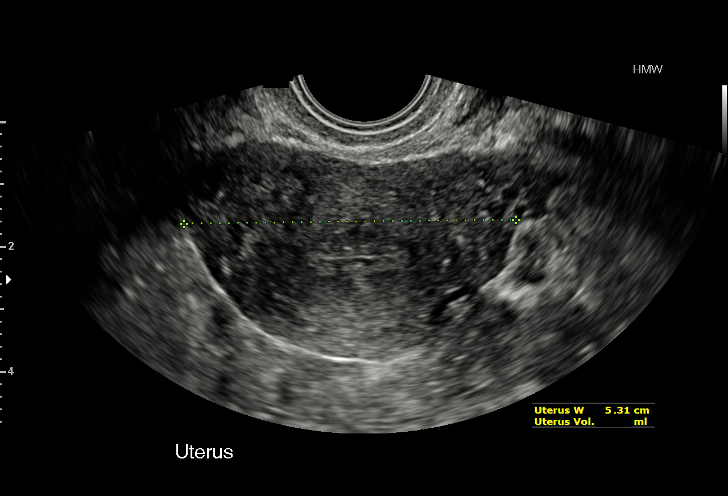
[im 52/63]
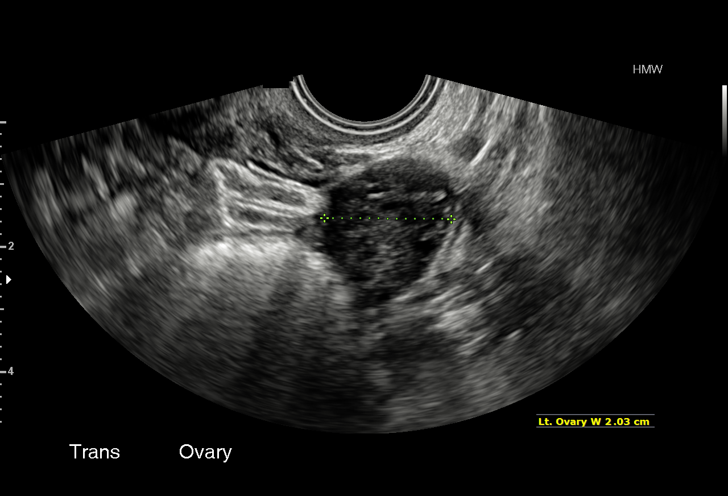
[im 57/63]
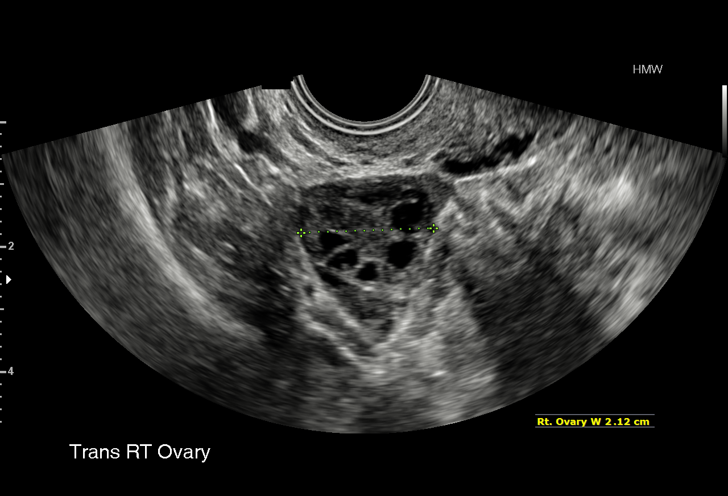
[im 63/63]
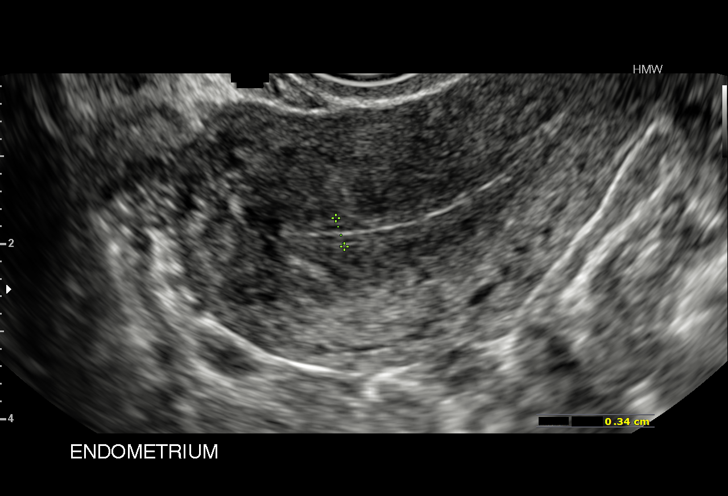

[15 of 25 positions shown; findings below may reference images not displayed]

FINDINGS: Uterus

Measurements: 7.9 x 3.7 x 5.3 cm. No fibroids or other mass
visualized.

Endometrium

Thickness: 3 mm in thickness.  No focal abnormality visualized.

Right ovary

Measurements: 3.5 x 2.3 x 2.1 cm. Normal appearance/no adnexal mass.

Left ovary

Measurements: 3.6 x 2.2 x 2.0 cm. Normal appearance/no adnexal mass.

Other findings

No abnormal free fluid.
IMPRESSION: Normal pelvic ultrasound.

## 2016-11-15 ENCOUNTER — Inpatient Hospital Stay (HOSPITAL_COMMUNITY)
Admission: AD | Admit: 2016-11-15 | Discharge: 2016-11-15 | Disposition: A | Discharge status: Home / Self Care | Payer: Self-pay | Source: Ambulatory Visit | Attending: Obstetrics & Gynecology | Admitting: Obstetrics & Gynecology

## 2016-11-15 ENCOUNTER — Encounter (HOSPITAL_COMMUNITY): Payer: Self-pay | Admitting: *Deleted

## 2016-11-15 ENCOUNTER — Ambulatory Visit: Payer: Self-pay | Admitting: Obstetrics

## 2016-11-15 DIAGNOSIS — Z79899 Other long term (current) drug therapy: Secondary | ICD-10-CM | POA: Insufficient documentation

## 2016-11-15 DIAGNOSIS — N939 Abnormal uterine and vaginal bleeding, unspecified: Secondary | ICD-10-CM

## 2016-11-15 DIAGNOSIS — Z3009 Encounter for other general counseling and advice on contraception: Secondary | ICD-10-CM

## 2016-11-15 DIAGNOSIS — Z888 Allergy status to other drugs, medicaments and biological substances status: Secondary | ICD-10-CM | POA: Insufficient documentation

## 2016-11-15 DIAGNOSIS — F17200 Nicotine dependence, unspecified, uncomplicated: Secondary | ICD-10-CM | POA: Insufficient documentation

## 2016-11-15 HISTORY — DX: Other specified health status: Z78.9

## 2016-11-15 LAB — URINALYSIS, ROUTINE W REFLEX MICROSCOPIC
Bilirubin Urine: NEGATIVE
Glucose, UA: NEGATIVE mg/dL
KETONES UR: NEGATIVE mg/dL
LEUKOCYTES UA: NEGATIVE
Nitrite: NEGATIVE
Protein, ur: NEGATIVE mg/dL
Specific Gravity, Urine: 1.02 (ref 1.005–1.030)
pH: 7 (ref 5.0–8.0)

## 2016-11-15 LAB — CBC
HEMATOCRIT: 41 % (ref 36.0–46.0)
Hemoglobin: 13.7 g/dL (ref 12.0–15.0)
MCH: 28.9 pg (ref 26.0–34.0)
MCHC: 33.4 g/dL (ref 30.0–36.0)
MCV: 86.5 fL (ref 78.0–100.0)
PLATELETS: 224 10*3/uL (ref 150–400)
RBC: 4.74 MIL/uL (ref 3.87–5.11)
RDW: 13.5 % (ref 11.5–15.5)
WBC: 8.2 10*3/uL (ref 4.0–10.5)

## 2016-11-15 LAB — WET PREP, GENITAL
CLUE CELLS WET PREP: NONE SEEN
Sperm: NONE SEEN
Trich, Wet Prep: NONE SEEN
Yeast Wet Prep HPF POC: NONE SEEN

## 2016-11-15 LAB — POCT PREGNANCY, URINE: Preg Test, Ur: NEGATIVE

## 2016-11-15 MED ORDER — NORGESTIMATE-ETH ESTRADIOL 0.25-35 MG-MCG PO TABS: 1.0000 | ORAL_TABLET | Freq: Every day | ORAL | 11 refills | Status: DC

## 2016-11-15 NOTE — MAU Note (Signed)
Pt states she has been on her period for the last 6-7 weeks.  Hx of irregular periods but has never bled for this long.  Has intermittent lower abd cramping.

## 2016-11-15 NOTE — Discharge Instructions (Signed)
Oral Contraception Information Oral contraceptive pills (OCPs) are medicines taken to prevent pregnancy. OCPs work by preventing the ovaries from releasing eggs. The hormones in OCPs also cause the cervical mucus to thicken, preventing the sperm from entering the uterus. The hormones also cause the uterine lining to become thin, not allowing a fertilized egg to attach to the inside of the uterus. OCPs are highly effective when taken exactly as prescribed. However, OCPs do not prevent sexually transmitted diseases (STDs). Safe sex practices, such as using condoms along with the pill, can help prevent STDs. Before taking the pill, you may have a physical exam and Pap test. Your health care provider may order blood tests. The health care provider will make sure you are a good candidate for oral contraception. Discuss with your health care provider the possible side effects of the OCP you may be prescribed. When starting an OCP, it can take 2 to 3 months for the body to adjust to the changes in hormone levels in your body. Types of oral contraception  The combination pill-This pill contains estrogen and progestin (synthetic progesterone) hormones. The combination pill comes in 21-day, 28-day, or 91-day packs. Some types of combination pills are meant to be taken continuously (365-day pills). With 21-day packs, you do not take pills for 7 days after the last pill. With 28-day packs, the pill is taken every day. The last 7 pills are without hormones. Certain types of pills have more than 21 hormone-containing pills. With 91-day packs, the first 84 pills contain both hormones, and the last 7 pills contain no hormones or contain estrogen only.  The minipill-This pill contains the progesterone hormone only. The pill is taken every day continuously. It is very important to take the pill at the same time each day. The minipill comes in packs of 28 pills. All 28 pills contain the hormone. Advantages of oral  contraceptive pills  Decreases premenstrual symptoms.  Treats menstrual period cramps.  Regulates the menstrual cycle.  Decreases a heavy menstrual flow.  May treatacne, depending on the type of pill.  Treats abnormal uterine bleeding.  Treats polycystic ovarian syndrome.  Treats endometriosis.  Can be used as emergency contraception. Things that can make oral contraceptive pills less effective OCPs can be less effective if:  You forget to take the pill at the same time every day.  You have a stomach or intestinal disease that lessens the absorption of the pill.  You take OCPs with other medicines that make OCPs less effective, such as antibiotics, certain HIV medicines, and some seizure medicines.  You take expired OCPs.  You forget to restart the pill on day 7, when using the packs of 21 pills.  Risks associated with oral contraceptive pills Oral contraceptive pills can sometimes cause side effects, such as:  Headache.  Nausea.  Breast tenderness.  Irregular bleeding or spotting.  Combination pills are also associated with a small increased risk of:  Blood clots.  Heart attack.  Stroke.  This information is not intended to replace advice given to you by your health care provider. Make sure you discuss any questions you have with your health care provider. Document Released: 06/12/2002 Document Revised: 08/28/2015 Document Reviewed: 09/10/2012 [employer] Interactive Patient Education  2018 [employer]  

## 2016-11-15 NOTE — MAU Provider Note (Signed)
History     CSN: [id number]  Arrival date and time: 11/15/16 7829   First Provider Initiated Contact with Patient 11/15/16 1024      Chief Complaint  Patient presents with  . Vaginal Bleeding   Stacey Hines is a 21 y.o. G0P0000 who presents today with vaginal bleeding. She states that she has been bleeding everyday since 10/13/16. Sometimes the bleeding is similar in flow to period, and sometimes it is spotting only. She denies any pain. She is interested in birth control. She had a similar episode in 2017, but did not keep her clinic appointment of follow up. No recent pap (within the last 3 years) on file. Has appt scheduled with Dr. Clearance Coots on 11/30/16.    Vaginal Bleeding  The patient's primary symptoms include vaginal bleeding. This is a new problem. Episode onset: 10/13/16. The problem occurs constantly. The problem has been waxing and waning. The patient is experiencing no pain. She is not pregnant. Vaginal bleeding amount: patient reports that some day it will be spotting and some days it will be typical of normal menses.  She has not been passing clots. She has not been passing tissue. Nothing aggravates the symptoms. She has tried nothing for the symptoms. She is sexually active. She uses nothing (patient is interested in birth control) for contraception. Her menstrual history has been irregular (LNMP: approx 08/2016 ).   No past medical history on file.  No past surgical history on file.  No family history on file.  Social History  Substance Use Topics  . Smoking status: Current Some Day Smoker  . Smokeless tobacco: Never Used  . Alcohol use Yes     Comment: Ocassiona    Allergies:  Allergies  Allergen Reactions  . Pineapple Swelling    Mouth swells    Prescriptions Prior to Admission  Medication Sig Dispense Refill Last Dose  . acyclovir (ZOVIRAX) 400 MG tablet Take 1 tablet by mouth daily.   11/15/2016 at Unknown time  . Multiple Vitamin (MULTIVITAMIN WITH  MINERALS) TABS tablet Take 1 tablet by mouth daily.   11/15/2016 at Unknown time  . Omega-3 Fatty Acids (FISH OIL PO) Take 1 capsule by mouth every other day.   Past Week at Unknown time  . acetaminophen (TYLENOL) 500 MG tablet Take 500 mg by mouth every 6 (six) hours as needed for moderate pain.   prn  . acyclovir (ZOVIRAX) 400 MG tablet Take 1 tablet (400 mg total) by mouth 4 (four) times daily. 50 tablet 0     Review of Systems  Genitourinary: Positive for vaginal bleeding.   Physical Exam   Blood pressure 112/70, pulse 79, temperature 98.2 F (36.8 C), resp. rate 16, height 5\' 3"  (1.6 m), weight 155 lb (70.3 kg), last menstrual period 10/13/2016.  Physical Exam  Nursing note and vitals reviewed. Constitutional: She is oriented to person, place, and time. She appears well-developed and well-nourished. No distress.  HENT:  Head: Normocephalic.  Cardiovascular: Normal rate.   Respiratory: Effort normal.  GI: Soft. There is no tenderness. There is no rebound.  Genitourinary:  Genitourinary Comments:  External: no lesion Vagina: small amount of brown blood Cervix: pink, smooth, no CMT Uterus: NSSC Adnexa: NT   Neurological: She is alert and oriented to person, place, and time.  Skin: Skin is warm and dry.  Psychiatric: She has a normal mood and affect.   Results for orders placed or performed during the hospital encounter of 11/15/16 (from the past 24  hour(s))  Urinalysis, Routine w reflex microscopic     Status: Abnormal   Collection Time: 11/15/16  9:48 AM  Result Value Ref Range   Color, Urine YELLOW YELLOW   APPearance HAZY (A) CLEAR   Specific Gravity, Urine 1.020 1.005 - 1.030   pH 7.0 5.0 - 8.0   Glucose, UA NEGATIVE NEGATIVE mg/dL   Hgb urine dipstick MODERATE (A) NEGATIVE   Bilirubin Urine NEGATIVE NEGATIVE   Ketones, ur NEGATIVE NEGATIVE mg/dL   Protein, ur NEGATIVE NEGATIVE mg/dL   Nitrite NEGATIVE NEGATIVE   Leukocytes, UA NEGATIVE NEGATIVE   RBC / HPF 0-5 0  - 5 RBC/hpf   WBC, UA 0-5 0 - 5 WBC/hpf   Bacteria, UA RARE (A) NONE SEEN   Squamous Epithelial / LPF 6-30 (A) NONE SEEN   Mucous PRESENT   Pregnancy, urine POC     Status: None   Collection Time: 11/15/16 10:09 AM  Result Value Ref Range   Preg Test, Ur NEGATIVE NEGATIVE  Wet prep, genital     Status: Abnormal   Collection Time: 11/15/16 10:20 AM  Result Value Ref Range   Yeast Wet Prep HPF POC NONE SEEN NONE SEEN   Trich, Wet Prep NONE SEEN NONE SEEN   Clue Cells Wet Prep HPF POC NONE SEEN NONE SEEN   WBC, Wet Prep HPF POC FEW (A) NONE SEEN   Sperm NONE SEEN   CBC     Status: None   Collection Time: 11/15/16 10:40 AM  Result Value Ref Range   WBC 8.2 4.0 - 10.5 K/uL   RBC 4.74 3.87 - 5.11 MIL/uL   Hemoglobin 13.7 12.0 - 15.0 g/dL   HCT 13.041.0 86.536.0 - 78.446.0 %   MCV 86.5 78.0 - 100.0 fL   MCH 28.9 26.0 - 34.0 pg   MCHC 33.4 30.0 - 36.0 g/dL   RDW 69.613.5 29.511.5 - 28.415.5 %   Platelets 224 150 - 400 K/uL     MAU Course  Procedures  MDM   Assessment and Plan   1. Abnormal uterine bleeding (AUB)   2. Contraceptive use education    DC home Comfort measures reviewed  Bleeding precautions RX: sprintec 28 as directed #1 with 11RF Return to MAU as needed FU with OB as planned  Follow-up Information    Brock BadHarper, Charles A, MD Follow up.   Specialty:  Obstetrics and Gynecology Why:  as scheduled for 11/30/16  Contact information: [address] [place] [id number] [phone]            Thressa ShellerHeather Hogan 11/15/2016, 10:25 AM

## 2016-11-17 LAB — GC/CHLAMYDIA PROBE AMP (~~LOC~~) NOT AT ARMC
Chlamydia: NEGATIVE
NEISSERIA GONORRHEA: NEGATIVE

## 2016-11-30 ENCOUNTER — Encounter: Payer: Self-pay | Admitting: Obstetrics

## 2016-11-30 ENCOUNTER — Ambulatory Visit (INDEPENDENT_AMBULATORY_CARE_PROVIDER_SITE_OTHER): Payer: PRIVATE HEALTH INSURANCE | Admitting: Obstetrics

## 2016-11-30 VITALS — BP 137/78 | HR 75 | Wt 154.0 lb

## 2016-11-30 DIAGNOSIS — Z3041 Encounter for surveillance of contraceptive pills: Secondary | ICD-10-CM | POA: Diagnosis not present

## 2016-11-30 DIAGNOSIS — F17200 Nicotine dependence, unspecified, uncomplicated: Secondary | ICD-10-CM

## 2016-11-30 DIAGNOSIS — R110 Nausea: Secondary | ICD-10-CM

## 2016-11-30 DIAGNOSIS — R109 Unspecified abdominal pain: Secondary | ICD-10-CM | POA: Diagnosis not present

## 2016-11-30 DIAGNOSIS — T50905A Adverse effect of unspecified drugs, medicaments and biological substances, initial encounter: Secondary | ICD-10-CM

## 2016-11-30 MED ORDER — NORELGESTROMIN-ETH ESTRADIOL 150-35 MCG/24HR TD PTWK: 1.0000 | MEDICATED_PATCH | TRANSDERMAL | 12 refills | Status: DC

## 2016-11-30 NOTE — Addendum Note (Signed)
Addended by: Coral Ceo A on: 11/30/2016 04:41 PM   Modules accepted: Orders

## 2016-11-30 NOTE — Progress Notes (Signed)
Patient states she has had nausea and abdominal pains for 2 days. Patient is using the Sprintec and she has been having SE- she even has side effects off the pill. She has been on the pill for 1 week.

## 2016-11-30 NOTE — Progress Notes (Signed)
Subjective:    Stacey Hines is a 21 y.o. female who presents for contraception counseling. The patient has complaints nausea and abdominal pains since starting OCP's. The patient is sexually active. Pertinent past medical history: current smoker.  The information documented in the HPI was reviewed and verified.  Menstrual History: OB History    Gravida Para Term Preterm AB Living   0 0 0 0 0 0   SAB TAB Ectopic Multiple Live Births   0 0 0 0         Patient's last menstrual period was 09/14/2016.   Patient Active Problem List   Diagnosis Date Noted  . Vaginitis and vulvovaginitis, unspecified 06/27/2013   Past Medical History:  Diagnosis Date  . Medical history non-contributory     Past Surgical History:  Procedure Laterality Date  . NO PAST SURGERIES       Current Outpatient Prescriptions:  .  acetaminophen (TYLENOL) 500 MG tablet, Take 500 mg by mouth every 6 (six) hours as needed for moderate pain., Disp: , Rfl:  .  Multiple Vitamin (MULTIVITAMIN WITH MINERALS) TABS tablet, Take 1 tablet by mouth daily., Disp: , Rfl:  .  Omega-3 Fatty Acids (FISH OIL PO), Take 1 capsule by mouth every other day., Disp: , Rfl:  .  acyclovir (ZOVIRAX) 400 MG tablet, Take 1 tablet by mouth daily., Disp: , Rfl:  .  norgestimate-ethinyl estradiol (SPRINTEC 28) 0.25-35 MG-MCG tablet, Take 1 tablet by mouth daily. (Patient not taking: Reported on 11/30/2016), Disp: 1 Package, Rfl: 11 Allergies  Allergen Reactions  . Pineapple Swelling    Mouth swells    Social History  Substance Use Topics  . Smoking status: Current Some Day Smoker  . Smokeless tobacco: Never Used  . Alcohol use Yes     Comment: Ocassiona    History reviewed. No pertinent family history.     Review of Systems Constitutional: negative for weight loss Genitourinary:negative for abnormal menstrual periods and vaginal discharge   Objective:   BP 137/78   Pulse 75   Wt 154 lb (69.9 kg)   LMP 09/14/2016  Comment: continuous bleeding- until started pill  BMI 27.28 kg/m    PE:  Deferred   Lab Review Urine pregnancy test Labs reviewed yes Radiologic studies reviewed no  >50% of 10 min visit spent on counseling and coordination of care.    Assessment and Plan:    21 y.o., discontinuing OCP (estrogen/progesterone),and starting the Patch, no contraindications.   1. Encounter for surveillance of contraceptive pills  2. Drug side effects, initial encounter - extreme nausea and abdominal pains  3. Tobacco dependence - tobacco cessation medical program strongly encouraged in order to continue hormonal combination contraceptives - explained the relative contraindication of smoking and hormonal contraceptives at her age  Plan:    All questions answered. Contraception: Ortho-Evra patches weekly. Diagnosis explained in detail, including differential. Discussed healthy lifestyle modifications. Agricultural engineer distributed. Follow up in 3 months.     No orders of the defined types were placed in this encounter.  No orders of the defined types were placed in this encounter.

## 2017-01-10 ENCOUNTER — Ambulatory Visit (INDEPENDENT_AMBULATORY_CARE_PROVIDER_SITE_OTHER): Payer: PRIVATE HEALTH INSURANCE | Admitting: Obstetrics

## 2017-01-10 ENCOUNTER — Encounter: Payer: Self-pay | Admitting: Obstetrics

## 2017-01-10 VITALS — BP 112/75 | HR 76 | Wt 155.9 lb

## 2017-01-10 DIAGNOSIS — N944 Primary dysmenorrhea: Secondary | ICD-10-CM | POA: Diagnosis not present

## 2017-01-10 DIAGNOSIS — A6004 Herpesviral vulvovaginitis: Secondary | ICD-10-CM

## 2017-01-10 DIAGNOSIS — Z113 Encounter for screening for infections with a predominantly sexual mode of transmission: Secondary | ICD-10-CM | POA: Diagnosis not present

## 2017-01-10 DIAGNOSIS — Z3009 Encounter for other general counseling and advice on contraception: Secondary | ICD-10-CM | POA: Diagnosis not present

## 2017-01-10 DIAGNOSIS — Z30011 Encounter for initial prescription of contraceptive pills: Secondary | ICD-10-CM

## 2017-01-10 DIAGNOSIS — N898 Other specified noninflammatory disorders of vagina: Secondary | ICD-10-CM | POA: Diagnosis not present

## 2017-01-10 MED ORDER — LO LOESTRIN FE 1 MG-10 MCG / 10 MCG PO TABS: 1.0000 | ORAL_TABLET | Freq: Every day | ORAL | 4 refills | Status: DC

## 2017-01-10 MED ORDER — VALACYCLOVIR HCL 500 MG PO TABS: ORAL_TABLET | ORAL | 5 refills | Status: DC

## 2017-01-10 MED ORDER — IBUPROFEN 800 MG PO TABS: 800.0000 mg | ORAL_TABLET | Freq: Three times a day (TID) | ORAL | 5 refills | Status: DC | PRN

## 2017-01-10 MED ORDER — PRENATAL PLUS 27-1 MG PO TABS: 1.0000 | ORAL_TABLET | Freq: Every day | ORAL | 0 refills | Status: DC

## 2017-01-10 NOTE — Progress Notes (Signed)
Presents for [facility] follow up. Used Patch for 1 week only 8/28-12/10/16 because of extreme abdominal pain and headaches.  She requested STD Testing.

## 2017-01-10 NOTE — Patient Instructions (Addendum)
Genital Herpes Genital herpes is a common sexually transmitted infection (STI) that is caused by a virus. The virus spreads from person to person through sexual contact. Infection can cause itching, blisters, and sores around the genitals or rectum. Symptoms may last several days and then go away This is called an outbreak. However, the virus remains in your body, so you may have more outbreaks in the future. The time between outbreaks varies and can be months or years. Genital herpes affects men and women. It is particularly concerning for pregnant women because the virus can be passed to the baby during delivery and can cause serious problems. Genital herpes is also a concern for people who have a weak disease-fighting (immune) system. What are the causes? This condition is caused by the herpes simplex virus (HSV) type 1 or type 2. The virus may spread through:  Sexual contact with an infected person, including vaginal, anal, and oral sex.  Contact with fluid from a herpes sore.  The skin. This means that you can get herpes from an infected partner even if he or she does not have a visible sore or does not know that he or she is infected. What increases the risk? You are more likely to develop this condition if:  You have sex with many partners.  You do not use latex condoms during sex. What are the signs or symptoms? Most people do not have symptoms (asymptomatic) or have mild symptoms that may be mistaken for other skin problems. Symptoms may include:  Small red bumps near the genitals, rectum, or mouth. These bumps turn into blisters and then turn into sores.  Flu-like symptoms, including:  Fever.  Body aches.  Swollen lymph nodes.  Headache.  Painful urination.  Pain and itching in the genital area or rectal area.  Vaginal discharge.  Tingling or shooting pain in the legs and buttocks. Generally, symptoms are more severe and last longer during the first (primary)  outbreak. Flu-like symptoms are also more common during the primary outbreak. How is this diagnosed? Genital herpes may be diagnosed based on:  A physical exam.  Your medical history.  Blood tests.  A test of a fluid sample (culture) from an open sore. How is this treated? There is no cure for this condition, but treatment with antiviral medicines that are taken by mouth (orally) can do the following:  Speed up healing and relieve symptoms.  Help to reduce the spread of the virus to sexual partners.  Limit the chance of future outbreaks, or make future outbreaks shorter.  Lessen symptoms of future outbreaks. Your health care provider may also recommend pain relief medicines, such as aspirin or ibuprofen. Follow these instructions at home: Sexual activity   Do not have sexual contact during active outbreaks.  Practice safe sex. Latex condoms and female condoms may help prevent the spread of the herpes virus. General instructions   Keep the affected areas dry and clean.  Take over-the-counter and prescription medicines only as told by your health care provider.  Avoid rubbing or touching blisters and sores. If you do touch blisters or sores:  Wash your hands thoroughly with soap and water.  Do not touch your eyes afterward.  To help relieve pain or itching, you may take the following actions as directed by your health care provider:  Apply a cold, wet cloth (cold compress) to affected areas 4-6 times a day.  Apply a substance that protects your skin and reduces bleeding (astringent).  Apply a   gel that helps relieve pain around sores (lidocaine gel).  Take a warm, shallow bath that cleans the genital area (sitz bath).  Keep all follow-up visits as told by your health care provider. This is important. How is this prevented?  Use condoms. Although anyone can get genital herpes during sexual contact, even with the use of a condom, a condom can provide some  protection.  Avoid having multiple sexual partners.  Talk with your sexual partner about any symptoms either of you may have. Also, talk with your partner about any history of STIs.  Get tested for STIs before you have sex. Ask your partner to do the same.  Do not have sexual contact if you have symptoms of genital herpes. Contact a health care provider if:  Your symptoms are not improving with medicine.  Your symptoms return.  You have new symptoms.  You have a fever.  You have abdominal pain.  You have redness, swelling, or pain in your eye.  You notice new sores on other parts of your body.  You are a woman and experience bleeding between menstrual periods.  You have had herpes and you become pregnant or plan to become pregnant. Summary  Genital herpes is a common sexually transmitted infection (STI) that is caused by the herpes simplex virus (HSV) type 1 or type 2.  These viruses are most often spread through sexual contact with an infected person.  You are more likely to develop this condition if you have sex with many partners or you have unprotected sex.  Most people do not have symptoms (asymptomatic) or have mild symptoms that may be mistaken for other skin problems. Symptoms occur as outbreaks that may happen months or years apart.  There is no cure for this condition, but treatment with oral antiviral medicines can reduce symptoms, reduce the chance of spreading the virus to a partner, prevent future outbreaks, or shorten future outbreaks. This information is not intended to replace advice given to you by your health care provider. Make sure you discuss any questions you have with your health care provider. Document Released: 03/19/2000 Document Revised: 02/20/2016 Document Reviewed: 02/20/2016 Elsevier Interactive Patient Education  2017 Elsevier Inc.  

## 2017-01-10 NOTE — Progress Notes (Signed)
Subjective:    Stacey Hines is a 21 y.o. female who presents for contraception counseling. The patient has no complaints today. The patient is sexually active. Pertinent past medical history: current smoker.  The information documented in the HPI was reviewed and verified.  Menstrual History: OB History    Gravida Para Term Preterm AB Living   0 0 0 0 0 0   SAB TAB Ectopic Multiple Live Births   0 0 0 0         Patient's last menstrual period was 12/20/2016 (approximate).   Patient Active Problem List   Diagnosis Date Noted  . Vaginitis and vulvovaginitis, unspecified 06/27/2013   Past Medical History:  Diagnosis Date  . Medical history non-contributory     Past Surgical History:  Procedure Laterality Date  . NO PAST SURGERIES       Current Outpatient Prescriptions:  .  acetaminophen (TYLENOL) 500 MG tablet, Take 500 mg by mouth every 6 (six) hours as needed for moderate pain., Disp: , Rfl:  .  ibuprofen (ADVIL,MOTRIN) 800 MG tablet, Take 1 tablet (800 mg total) by mouth every 8 (eight) hours as needed., Disp: 30 tablet, Rfl: 5 .  LO LOESTRIN FE 1 MG-10 MCG / 10 MCG tablet, Take 1 tablet by mouth daily., Disp: 3 Package, Rfl: 4 .  Multiple Vitamin (MULTIVITAMIN WITH MINERALS) TABS tablet, Take 1 tablet by mouth daily., Disp: , Rfl:  .  norgestimate-ethinyl estradiol (SPRINTEC 28) 0.25-35 MG-MCG tablet, Take 1 tablet by mouth daily. (Patient not taking: Reported on 11/30/2016), Disp: 1 Package, Rfl: 11 .  Omega-3 Fatty Acids (FISH OIL PO), Take 1 capsule by mouth every other day., Disp: , Rfl:  .  prenatal vitamin w/FE, FA (PRENATAL 1 + 1) 27-1 MG TABS tablet, Take 1 tablet by mouth daily at 12 noon., Disp: 30 each, Rfl: 0 .  valACYclovir (VALTREX) 500 MG tablet, Take 1 tablet p.o. bid x 3 days prn each outbreak., Disp: 30 tablet, Rfl: 5 Allergies  Allergen Reactions  . Pineapple Swelling    Mouth swells    Social History  Substance Use Topics  . Smoking status:  Current Some Day Smoker  . Smokeless tobacco: Never Used  . Alcohol use Yes     Comment: Ocassiona    History reviewed. No pertinent family history.     Review of Systems Constitutional: negative for weight loss Genitourinary:negative for abnormal menstrual periods and vaginal discharge   Objective:   BP 112/75   Pulse 76   Wt 155 lb 14.4 oz (70.7 kg)   LMP 12/20/2016 (Approximate)   BMI 27.62 kg/m            General:  Alert and no distress Abdomen:  normal findings: no organomegaly, soft, non-tender and no hernia  Pelvis:  External genitalia: normal general appearance Urinary system: urethral meatus normal and bladder without fullness, nontender Vaginal: normal without tenderness, induration or masses Cervix: normal appearance Adnexa: normal bimanual exam Uterus: anteverted and non-tender, normal size   Lab Review Urine pregnancy test Labs reviewed yes Radiologic studies reviewed no  50% of 15 min visit spent on counseling and coordination of care.    Assessment:    21 y.o., discontinuing Ortho-Evra patches weekly and starting low dose OCP, no contraindications.   Plan:   Tobacco cessation strongly encouraged to continue OCP's.  Risks of tobacco and OCP's explained.  All questions answered. Chlamydia specimen. Discussed healthy lifestyle modifications. Follow up in 2 months. GC specimen. Urinalysis.  Wet prep.    Meds ordered this encounter  Medications  . LO LOESTRIN FE 1 MG-10 MCG / 10 MCG tablet    Sig: Take 1 tablet by mouth daily.    Dispense:  3 Package    Refill:  4    Submit other coverage code 3  BIN:  [id number]  PCN:  [id number]   GRP:  [id number]   ID:  [id number]  . prenatal vitamin w/FE, FA (PRENATAL 1 + 1) 27-1 MG TABS tablet    Sig: Take 1 tablet by mouth daily at 12 noon.    Dispense:  30 each    Refill:  0  . ibuprofen (ADVIL,MOTRIN) 800 MG tablet    Sig: Take 1 tablet (800 mg total) by mouth every 8 (eight) hours as needed.    Dispense:  30  tablet    Refill:  5  . valACYclovir (VALTREX) 500 MG tablet    Sig: Take 1 tablet p.o. bid x 3 days prn each outbreak.    Dispense:  30 tablet    Refill:  5   Orders Placed This Encounter  Procedures  . HIV antibody  . Hepatitis B surface antigen  . RPR  . Hepatitis C antibody

## 2017-01-11 LAB — CERVICOVAGINAL ANCILLARY ONLY
Bacterial vaginitis: NEGATIVE
CANDIDA VAGINITIS: NEGATIVE
Chlamydia: NEGATIVE
Neisseria Gonorrhea: NEGATIVE
Trichomonas: NEGATIVE

## 2017-01-11 LAB — HEPATITIS C ANTIBODY

## 2017-01-11 LAB — RPR: RPR Ser Ql: NONREACTIVE

## 2017-01-11 LAB — HIV ANTIBODY (ROUTINE TESTING W REFLEX): HIV Screen 4th Generation wRfx: NONREACTIVE

## 2017-01-11 LAB — HEPATITIS B SURFACE ANTIGEN: Hepatitis B Surface Ag: NEGATIVE

## 2017-06-22 ENCOUNTER — Encounter: Payer: Self-pay | Admitting: *Deleted

## 2017-07-01 ENCOUNTER — Ambulatory Visit (INDEPENDENT_AMBULATORY_CARE_PROVIDER_SITE_OTHER): Payer: Self-pay | Admitting: Family Medicine

## 2017-07-01 ENCOUNTER — Encounter: Payer: Self-pay | Admitting: Family Medicine

## 2017-07-01 ENCOUNTER — Ambulatory Visit: Payer: Self-pay | Admitting: Family Medicine

## 2017-07-01 VITALS — BP 110/82 | HR 84 | Temp 98.4°F | Ht 63.0 in | Wt 154.0 lb

## 2017-07-01 DIAGNOSIS — Z7689 Persons encountering health services in other specified circumstances: Secondary | ICD-10-CM

## 2017-07-01 DIAGNOSIS — Z113 Encounter for screening for infections with a predominantly sexual mode of transmission: Secondary | ICD-10-CM

## 2017-07-01 DIAGNOSIS — Z0289 Encounter for other administrative examinations: Secondary | ICD-10-CM

## 2017-07-01 DIAGNOSIS — A6004 Herpesviral vulvovaginitis: Secondary | ICD-10-CM

## 2017-07-01 MED ORDER — VALACYCLOVIR HCL 500 MG PO TABS: ORAL_TABLET | ORAL | 5 refills | Status: DC

## 2017-07-01 NOTE — Progress Notes (Signed)
Patient presents to clinic today to establish care.  SUBJECTIVE: PMH: Pt is a 22 yo female with pmh sig for HSV.  Patient cannot recall the name of the provider she previously saw.  Patient is also requesting STD testing.  Genital herpes: -Patient has been taking Valtrex 500 mg as needed -Patient states last outbreak was a month ago when she was stressed. -Pt is sexually active.  She endorses using condoms -Patient requesting refill on Valtrex.  Allergies: Pineapple-swelling  Past surgical history: None  Social history: Patient is single.  She is currently in school at [employer], where she hopes to become a nurse.  Patient is also working.  Patient denies tobacco or drug use.  Patient endorses rare alcohol use.   Past Medical History:  Diagnosis Date  . Medical history non-contributory     Past Surgical History:  Procedure Laterality Date  . NO PAST SURGERIES      No current outpatient medications on file prior to visit.   No current facility-administered medications on file prior to visit.     Allergies  Allergen Reactions  . Pineapple Swelling    Mouth swells    History reviewed. No pertinent family history.  Social History   Socioeconomic History  . Marital status: Single    Spouse name: Not on file  . Number of children: Not on file  . Years of education: Not on file  . Highest education level: Not on file  Occupational History  . Not on file  Social Needs  . Financial resource strain: Not on file  . Food insecurity:    Worry: Not on file    Inability: Not on file  . Transportation needs:    Medical: Not on file    Non-medical: Not on file  Tobacco Use  . Smoking status: Current Some Day Smoker  . Smokeless tobacco: Never Used  Substance and Sexual Activity  . Alcohol use: Yes    Comment: Ocassiona  . Drug use: No  . Sexual activity: Yes    Partners: Male    Birth control/protection: Condom  Lifestyle  . Physical activity:    Days per  week: Not on file    Minutes per session: Not on file  . Stress: Not on file  Relationships  . Social connections:    Talks on phone: Not on file    Gets together: Not on file    Attends religious service: Not on file    Active member of club or organization: Not on file    Attends meetings of clubs or organizations: Not on file    Relationship status: Not on file  . Intimate partner violence:    Fear of current or ex partner: Not on file    Emotionally abused: Not on file    Physically abused: Not on file    Forced sexual activity: Not on file  Other Topics Concern  . Not on file  Social History Narrative  . Not on file    ROS General: Denies fever, chills, night sweats, changes in weight, changes in appetite HEENT: Denies headaches, ear pain, changes in vision, rhinorrhea, sore throat CV: Denies CP, palpitations, SOB, orthopnea Pulm: Denies SOB, cough, wheezing GI: Denies abdominal pain, nausea, vomiting, diarrhea, constipation GU: Denies dysuria, hematuria, frequency, vaginal discharge Msk: Denies muscle cramps, joint pains Neuro: Denies weakness, numbness, tingling Skin: Denies rashes, bruising Psych: Denies depression, anxiety, hallucinations  BP 110/82 (BP Location: Left Arm, Patient Position: Sitting, Cuff Size:  Normal)   Pulse 84   Temp 98.4 F (36.9 C) (Oral)   Ht 5\' 3"  (1.6 m)   Wt 154 lb (69.9 kg)   LMP 07/01/2017 (Exact Date)   SpO2 99%   BMI 27.28 kg/m   Physical Exam Gen. Pleasant, well developed, well-nourished, in NAD HEENT - [place]/AT, PERRL, no scleral icterus, no nasal drainage, pharynx without erythema or exudate.  TMs normal bilaterally.  No cervical lymphadenopathy. Lungs: no use of accessory muscles, CTAB, no wheezes, rales or rhonchi Cardiovascular: RRR, No r/g/m, no peripheral edema Abdomen: BS present, soft, nontender,nondistended Neuro:  A&Ox3, CN II-XII intact, normal gait Skin:  Warm, dry, intact, no lesions Psych: normal affect, mood  appropriate  No results found for this or any previous visit (from the past 2160 hour(s)).  Assessment/Plan: Routine screening for STI (sexually transmitted infection)  -Discussed safe sex practices -Patient given handout - Plan: HIV antibody (with reflex), RPR, C. trachomatis/N. gonorrhoeae RNA  Herpes simplex vulvovaginitis -Not currently having an outbreak - Plan: valACYclovir (VALTREX) 500 MG tablet twice daily for 3 days during outbreak  Encounter to establish care -We reviewed the PMH, PSH, FH, SH, Meds and Allergies. -We provided refills for any medications we will prescribe as needed. -We addressed current concerns per orders and patient instructions. -We have asked for records for pertinent exams, studies, vaccines and notes from previous providers. -We have advised patient to follow up per instructions below.  Follow-up PRN  Abbe Amsterdam, MD

## 2017-07-01 NOTE — Patient Instructions (Signed)
Preventing Sexually Transmitted Infections, Adult Sexually transmitted infections (STIs) are diseases that are passed (transmitted) from person to person through bodily fluids exchanged during sex or sexual contact. Bodily fluids include saliva, semen, blood, vaginal mucus, and urine. You may have an increased risk for developing an STI if you have unprotected oral, vaginal, or anal sex. Some common STIs include:  Herpes.  Hepatitis B.  Chlamydia.  Gonorrhea.  Syphilis.  HPV (human papillomavirus).  HIV (humanimmunodeficiency virus), the virus that can cause AIDS (acquired immunodeficiency virus).  How can I protect myself from sexually transmitted infections? The only way to completely prevent STIs is not to have sex of any kind (practice abstinence). This includes oral, vaginal, or anal sex. If you are sexually active, take these actions to lower your risk of getting an STI:  Have only one sex partner (be monogamous) or limit the number of sexual partners you have.  Stay up-to-date on immunizations. Certain vaccines can lower your risk of getting certain STIs, such as: ? Hepatitis A and B vaccines. You may have been vaccinated as a young child, but likely need a booster shot as a teen or young adult. ? HPV vaccine. This vaccine is recommended if you are a man under age 22 or a woman under age 27.  Use methods that prevent the exchange of body fluids between partners (barrier protection) every time you have sex. Barrier protection can be used during oral, vaginal, or anal sex. Commonly used barrier methods include: ? Female condom. ? Female condom. ? Dental dam.  Get tested regularly for STIs. Have your sexual partner get tested regularly as well.  Avoid mixing alcohol, drugs, and sex. Alcohol and drug use can affect your ability to make good decisions and can lead to risky sexual behaviors.  Ask your health care provider about taking pre-exposure prophylaxis (PrEP) to prevent HIV  infection if you: ? Have a HIV-positive sexual partner. ? Have multiple sexual partners or partners who do not know their HIV status, and do not regularly use a condom during sex. ? Use injection drugs and share needles.  Birth control pills, injections, implants, and intrauterine devices (IUDs) do not protect against STIs. To prevent both STIs and pregnancy, always use a condom with another form of birth control. Some STIs, such as herpes, are spread through skin to skin contact. A condom does not protect you from getting such STIs. If you or your partner have herpes and there is an active flare with open sores, avoid all sexual contact. Why are these changes important? Taking steps to practice safe sex protects you and others. Many STIs can be cured. However, some STIs are not curable and will affect you for the rest of your life. STIs can be passed on to another person even if you do not have symptoms. What can happen if changes are not made? Certain STIs may:  Require you to take medicine for the rest of your life.  Affect your ability to have children (your fertility).  Increase your risk for developing another STI or certain serious health conditions, such as: ? Cervical cancer. ? Head and neck cancer. ? Pelvic inflammatory disease (PID) in women. ? Organ damage or damage to other parts of your body, if the infection spreads.  Be passed to a baby during childbirth.  How are sexually transmitted infections treated? If you or your partner know or think that you may have an STI:  Talk with your healthcare provider about what can be   done to treat it. Some STIs can be treated and cured with medicines.  For curable STIs, you and your partner should avoid sex during treatment and for several days after treatment is complete.  You and your partner should both be treated at the same time, if there is any chance that your partner is infected as well. If you get treatment but your partner  does not, your partner can re-infect you when you resume sexual contact.  Do not have unprotected sex.  Where to find more information: Learn more about sexually transmitted diseases and infections from:  Centers for Disease Control and Prevention: ? More information about specific STIs: www.cdc.gov/std ? Find places to get sexual health counseling and treatment for free or for a low cost: gettested.cdc.gov  U.S. Department of Health and Human Services: www.womenshealth.gov/publications/our-publications/fact-sheet/sexually-transmitted-infections.html  Summary  The only way to completely prevent STIs is not to have sex (practice abstinence), including oral, vaginal, or anal sex.  STIs can spread through saliva, semen, blood, vaginal mucus, urine, or sexual contact.  If you do have sex, limit your number of sexual partners and use a barrier protection method every time you have sex.  If you develop an STI, get treated right away and ask your partner to be treated as well. Do not resume having sex until both of you have completed treatment for the STI. This information is not intended to replace advice given to you by your health care provider. Make sure you discuss any questions you have with your health care provider. Document Released: 03/18/2016 Document Revised: 03/18/2016 Document Reviewed: 03/18/2016 Elsevier Interactive Patient Education  2018 Elsevier Inc.  

## 2017-07-04 LAB — HIV ANTIBODY (ROUTINE TESTING W REFLEX): HIV 1&2 Ab, 4th Generation: NONREACTIVE

## 2017-07-04 LAB — C. TRACHOMATIS/N. GONORRHOEAE RNA
C. TRACHOMATIS RNA, TMA: NOT DETECTED
N. gonorrhoeae RNA, TMA: NOT DETECTED

## 2017-07-04 LAB — RPR: RPR Ser Ql: NONREACTIVE

## 2017-07-06 ENCOUNTER — Encounter: Payer: Self-pay | Admitting: Family Medicine

## 2017-07-20 ENCOUNTER — Telehealth: Payer: Self-pay | Admitting: Family Medicine

## 2017-07-20 NOTE — Telephone Encounter (Signed)
Attempted to contact pt regarding refill request; prescription dated 07/01/17 30 tablets, with 5 refills; left message for pt at [phone].

## 2017-07-20 NOTE — Telephone Encounter (Signed)
Copied from CRM [id number]. Topic: Quick Communication - Rx Refill/Question >> Jul 20, 2017 12:45 PM Zada GirtLander, Lumin L wrote: Medication: valACYclovir (VALTREX) 500 MG tablet Has the patient contacted their pharmacy? No. (new pharm) (Agent: If no, request that the patient contact the pharmacy for the refill.) Preferred Pharmacy (with phone number or street name): [facility] [address], [place] - [address]  [place] [id number] Phone: [phone] Fax: [phone] Agent: Please be advised that RX refills may take up to 3 business days. We ask that you follow-up with your pharmacy.

## 2017-07-28 NOTE — Telephone Encounter (Signed)
Pt called back and would like to speak w/ a nurse about her medication to see what needs to be done to get it switched to another pharmacy

## 2017-07-28 NOTE — Telephone Encounter (Signed)
Patient called to get clarification on her request, [facility] and she wanted to know how to get the medication switched. I advised to call [facility] and ask to retrieve the prescription from [facility] and in the future when she request refills, to indicate [facility], she verbalized understanding.

## 2017-09-22 ENCOUNTER — Ambulatory Visit: Payer: Self-pay | Admitting: Family Medicine

## 2017-09-22 NOTE — Telephone Encounter (Signed)
Pt  In no distress at this time - pt denies being dizzy at this time - pt denies any severe low abd pain - Pt prefers to see Dr  Salomon FickBanks rather than OB/GYN advised if worse [facility]  Reason for Disposition . [1] Periods with > 6 soaked pads or tampons per day AND [2] last > 7 days  Answer Assessment - Initial Assessment Questions 1. AMOUNT: "Describe the bleeding that you are having."    - SPOTTING: spotting, or pinkish / brownish mucous discharge; does not fill panti-liner or pad    - MILD:  less than 1 pad / hour; less than patient's usual menstrual bleeding   - MODERATE: 1-2 pads / hour; small-medium blood clots (e.g., pea, grape, small coin)    - SEVERE: soaking 2 or more pads/hour for 2 or more hours; bleeding not contained by pads or continuous red blood from vagina; large blood clots (e.g., golf ball, large coin)         MOD  2. ONSET: "When did the bleeding begin?" "Is it continuing now?"         3  WEEKS  3. MENSTRUAL PERIOD: "When was the last normal menstrual period?" "How is this different than your period?"          2 MONTHS PRIOR TO THE BEGINNING   4. REGULARITY: "How regular are your periods?"        HISTORY OF IRREG PERIODS BEFORE   5. ABDOMINAL PAIN: "Do you have any pain?" "How bad is the pain?"  (e.g., Scale 1-10; mild, moderate, or severe)   - MILD (1-3): doesn't interfere with normal activities, abdomen soft and not tender to touch    - MODERATE (4-7): interferes with normal activities or awakens from sleep, tender to touch    - SEVERE (8-10): excruciating pain, doubled over, unable to do any normal activities         MILD   6. PREGNANCY: "Could you be pregnant?" "Are you sexually active?" "Did you recently give birth?"         No   7. BREASTFEEDING: "Are you breastfeeding?"       NO  8. HORMONES: "Are you taking any hormone medications, prescription or OTC?" (e.g., birth control pills, estrogen)       Not right now  9. BLOOD THINNERS: "Do you  take any blood thinners?" (e.g., Coumadin/warfarin, Pradaxa/dabigatran, aspirin)         No 10. CAUSE: "What do you think is causing the bleeding?" (e.g., recent gyn surgery, recent gyn procedure; known bleeding disorder, cervical cancer, polycystic ovarian disease, fibroids)            Has had similar episode in past  But not this prolonged  11. HEMODYNAMIC STATUS: "Are you weak or feeling lightheaded?" If so, ask: "Can you stand and walk normally?"          No  12. OTHER SYMPTOMS: "What other symptoms are you having with the bleeding?" (e.g., passed tissue, vaginal discharge, fever, menstrual-type cramps)           Clots at times  Protocols used: VAGINAL BLEEDING - ABNORMAL-A-AH

## 2017-09-23 ENCOUNTER — Ambulatory Visit (INDEPENDENT_AMBULATORY_CARE_PROVIDER_SITE_OTHER): Payer: No Typology Code available for payment source | Admitting: Family Medicine

## 2017-09-23 ENCOUNTER — Encounter: Payer: Self-pay | Admitting: Family Medicine

## 2017-09-23 VITALS — BP 100/76 | HR 90 | Temp 98.9°F | Wt 152.0 lb

## 2017-09-23 DIAGNOSIS — N3001 Acute cystitis with hematuria: Secondary | ICD-10-CM

## 2017-09-23 DIAGNOSIS — N939 Abnormal uterine and vaginal bleeding, unspecified: Secondary | ICD-10-CM | POA: Diagnosis not present

## 2017-09-23 LAB — POC URINALSYSI DIPSTICK (AUTOMATED)
BILIRUBIN UA: NEGATIVE
GLUCOSE UA: NEGATIVE
Ketones, UA: NEGATIVE
NITRITE UA: NEGATIVE
Protein, UA: NEGATIVE
SPEC GRAV UA: 1.02 (ref 1.010–1.025)
UROBILINOGEN UA: 0.2 U/dL
pH, UA: 6.5 (ref 5.0–8.0)

## 2017-09-23 LAB — CBC WITH DIFFERENTIAL/PLATELET
BASOS PCT: 0.3 % (ref 0.0–3.0)
Basophils Absolute: 0 10*3/uL (ref 0.0–0.1)
EOS ABS: 0.1 10*3/uL (ref 0.0–0.7)
Eosinophils Relative: 0.5 % (ref 0.0–5.0)
HEMATOCRIT: 40.5 % (ref 36.0–46.0)
HEMOGLOBIN: 13.6 g/dL (ref 12.0–15.0)
LYMPHS PCT: 23.5 % (ref 12.0–46.0)
Lymphs Abs: 2.5 10*3/uL (ref 0.7–4.0)
MCHC: 33.5 g/dL (ref 30.0–36.0)
MCV: 86.6 fl (ref 78.0–100.0)
Monocytes Absolute: 0.6 10*3/uL (ref 0.1–1.0)
Monocytes Relative: 5.7 % (ref 3.0–12.0)
Neutro Abs: 7.4 10*3/uL (ref 1.4–7.7)
Neutrophils Relative %: 70 % (ref 43.0–77.0)
Platelets: 287 10*3/uL (ref 150.0–400.0)
RBC: 4.68 Mil/uL (ref 3.87–5.11)
RDW: 13.5 % (ref 11.5–15.5)
WBC: 10.5 10*3/uL (ref 4.0–10.5)

## 2017-09-23 LAB — HCG, QUANTITATIVE, PREGNANCY

## 2017-09-23 LAB — TSH: TSH: 1.63 u[IU]/mL (ref 0.35–4.50)

## 2017-09-23 LAB — T4, FREE: Free T4: 0.9 ng/dL (ref 0.60–1.60)

## 2017-09-23 MED ORDER — NORGESTIMATE-ETH ESTRADIOL 0.25-35 MG-MCG PO TABS: ORAL_TABLET | ORAL | 0 refills | Status: DC

## 2017-09-23 MED ORDER — SULFAMETHOXAZOLE-TRIMETHOPRIM 800-160 MG PO TABS: 1.0000 | ORAL_TABLET | Freq: Two times a day (BID) | ORAL | 0 refills | Status: AC

## 2017-09-23 NOTE — Progress Notes (Signed)
Subjective:    Patient ID: Stacey Hines, female    DOB: 1995-04-14, 22 y.o.   MRN: [id number]  No chief complaint on file. Patient accompanied by her brother.  HPI Patient was seen today for acute concern.  Pt endorses menses x 3 wks.  In the past was on the patch (caused per chart review HAs and abd pain, per pt skin irritation) and lo loestrin.  Pt states she kept forgetting to take the OCPs.  Pt endorses h/o irregular menses, may skip months or having bleeding for wks.  Pt endorses unprotected sex 2 months ago.  Pt endorses abd cramps and heavy bleeding changing tampon or pad 1-2x/hr.  Pt endorses recent odor with bleeding.  Pt denied weakness, dizziness, vaginal d/c, ordor, urinary frequency, or dysuria prior to menses.  Past Medical History:  Diagnosis Date  . Medical history non-contributory     Allergies  Allergen Reactions  . Pineapple Swelling    Mouth swells    ROS General: Denies fever, chills, night sweats, changes in weight, changes in appetite HEENT: Denies headaches, ear pain, changes in vision, rhinorrhea, sore throat CV: Denies CP, palpitations, SOB, orthopnea Pulm: Denies SOB, cough, wheezing GI: Denies abdominal pain, nausea, vomiting, diarrhea, constipation GU: Denies dysuria, hematuria, frequency, vaginal discharge  +irregular menses Msk: Denies muscle cramps, joint pains Neuro: Denies weakness, numbness, tingling Skin: Denies rashes, bruising Psych: Denies depression, anxiety, hallucinations     Objective:    Blood pressure 100/76, pulse 90, temperature 98.9 F (37.2 C), temperature source Oral, weight 152 lb (68.9 kg), SpO2 95 %.   Gen. Pleasant, well-nourished, in no distress, normal affect   HEENT: [place]/AT, face symmetric, conjunctiva clear, no scleral icterus, PERRLA, nares patent without drainage Lungs: no accessory muscle use, CTAB, no wheezes or rales Cardiovascular: RRR, no m/r/g, no peripheral edema Abdomen: BS present, soft, NT/ND, no  hepatosplenomegaly. Neuro:  A&Ox3, CN II-XII intact, normal gait   Wt Readings from Last 3 Encounters:  09/23/17 152 lb (68.9 kg)  07/01/17 154 lb (69.9 kg)  01/10/17 155 lb 14.4 oz (70.7 kg)    Lab Results  Component Value Date   WBC 8.2 11/15/2016   HGB 13.7 11/15/2016   HCT 41.0 11/15/2016   PLT 224 11/15/2016   GLUCOSE 99 12/08/2013   ALT 13 12/08/2013   AST 17 12/08/2013   NA 137 12/08/2013   K 4.0 12/08/2013   CL 101 12/08/2013   CREATININE 0.74 12/08/2013   BUN 10 12/08/2013   CO2 26 12/08/2013   TSH 3.292 05/11/2013    Assessment/Plan:  Abnormal uterine bleeding (AUB)  -UA with 1+ Leuks, SG 1.020, yellow color, 3+ RBCs -UHcg negative. -pt to take 2 of the active OCPs daily for 2 wks. - Plan: CBC with Differential/Platelet, norgestimate-ethinyl estradiol (SPRINTEC 28) 0.25-35 MG-MCG tablet, HCG, Quant, Pregnancy, TSH, T4, free, CBC with Differential/Platelet -f/u in 2 wks, sooner if needed.  Pt given RTC or ED precautions.   -pt should f/u with OB/Gyn for continued bleeding  Acute cystitis with hematuria  - Plan: sulfamethoxazole-trimethoprim (BACTRIM DS,SEPTRA DS) 800-160 MG tablet, POCT Urinalysis Dipstick (Automated), Urine Culture, Urine Culture  Abbe AmsterdamShannon Banks, MD

## 2017-09-23 NOTE — Patient Instructions (Signed)
Abnormal Uterine Bleeding Abnormal uterine bleeding can affect women at various stages in life, including teenagers, women in their reproductive years, pregnant women, and women who have reached menopause. Several kinds of uterine bleeding are considered abnormal, including:  Bleeding or spotting between periods.  Bleeding after sexual intercourse.  Bleeding that is heavier or more than normal.  Periods that last longer than usual.  Bleeding after menopause. Many cases of abnormal uterine bleeding are minor and simple to treat, while others are more serious. Any type of abnormal bleeding should be evaluated by your health care provider. Treatment will depend on the cause of the bleeding. Follow these instructions at home: Monitor your condition for any changes. The following actions may help to alleviate any discomfort you are experiencing:  Avoid the use of tampons and douches as directed by your health care provider.  Change your pads frequently. You should get regular pelvic exams and Pap tests. Keep all follow-up appointments for diagnostic tests as directed by your health care provider. Contact a health care provider if:  Your bleeding lasts more than 1 week.  You feel dizzy at times. Get help right away if:  You pass out.  You are changing pads every 15 to 30 minutes.  You have abdominal pain.  You have a fever.  You become sweaty or weak.  You are passing large blood clots from the vagina.  You start to feel nauseous and vomit. This information is not intended to replace advice given to you by your health care provider. Make sure you discuss any questions you have with your health care provider. Document Released: 03/22/2005 Document Revised: 09/03/2015 Document Reviewed: 10/19/2012 Elsevier Interactive Patient Education  2017 Elsevier Inc.  

## 2017-09-24 LAB — URINE CULTURE
MICRO NUMBER: 90745509
SPECIMEN QUALITY: ADEQUATE

## 2017-10-18 ENCOUNTER — Ambulatory Visit: Payer: No Typology Code available for payment source | Admitting: Family Medicine

## 2017-10-18 DIAGNOSIS — Z0289 Encounter for other administrative examinations: Secondary | ICD-10-CM

## 2017-10-27 ENCOUNTER — Other Ambulatory Visit: Payer: Self-pay | Admitting: Family Medicine

## 2017-10-27 DIAGNOSIS — A6004 Herpesviral vulvovaginitis: Secondary | ICD-10-CM

## 2017-10-27 NOTE — Telephone Encounter (Signed)
Copied from CRM [id number]. Topic: Quick Communication - Rx Refill/Question >> Oct 27, 2017  1:59 PM Arlyss Gandyichardson, Corley Maffeo N, NT wrote: Medication: valACYclovir (VALTREX) 500 MG tablet  Has the patient contacted their pharmacy? Yes.   (Agent: If no, request that the patient contact the pharmacy for the refill.) (Agent: If yes, when and what did the pharmacy advise?)  Preferred Pharmacy (with phone number or street name): [facility] [address]- [place], [place] - [id number] [phone] ([phone])      Agent: Please be advised that RX refills may take up to 3 business days. We ask that you follow-up with your pharmacy.

## 2017-10-28 NOTE — Telephone Encounter (Signed)
Ok to refill valtrex with 3 refills.

## 2017-10-28 NOTE — Telephone Encounter (Signed)
Please Advise if ok to refill Rx 

## 2017-10-28 NOTE — Telephone Encounter (Signed)
Valtrex refill Last Refill 07/01/17  Last OV: 07/01/17 PCP: Dr Abbe AmsterdamShannon Banks [facility] [place] :

## 2017-10-31 MED ORDER — VALACYCLOVIR HCL 500 MG PO TABS: ORAL_TABLET | ORAL | 3 refills | Status: DC

## 2017-10-31 NOTE — Telephone Encounter (Signed)
Rx done. 

## 2017-10-31 NOTE — Addendum Note (Signed)
Addended by: Johnella MoloneyFUNDERBURK, JO A on: 10/31/2017 01:21 PM   Modules accepted: Orders

## 2017-11-16 ENCOUNTER — Other Ambulatory Visit: Payer: Self-pay

## 2017-11-16 ENCOUNTER — Encounter (HOSPITAL_COMMUNITY): Payer: Self-pay | Admitting: Obstetrics and Gynecology

## 2017-11-16 ENCOUNTER — Emergency Department (HOSPITAL_COMMUNITY)
Admission: EM | Admit: 2017-11-16 | Discharge: 2017-11-16 | Disposition: A | Discharge status: Home / Self Care | Payer: No Typology Code available for payment source | Attending: Emergency Medicine | Admitting: Emergency Medicine

## 2017-11-16 ENCOUNTER — Emergency Department (HOSPITAL_COMMUNITY): Payer: No Typology Code available for payment source

## 2017-11-16 DIAGNOSIS — Y999 Unspecified external cause status: Secondary | ICD-10-CM | POA: Insufficient documentation

## 2017-11-16 DIAGNOSIS — S1091XA Abrasion of unspecified part of neck, initial encounter: Secondary | ICD-10-CM

## 2017-11-16 DIAGNOSIS — M7918 Myalgia, other site: Secondary | ICD-10-CM

## 2017-11-16 DIAGNOSIS — R0789 Other chest pain: Secondary | ICD-10-CM | POA: Diagnosis not present

## 2017-11-16 DIAGNOSIS — F17210 Nicotine dependence, cigarettes, uncomplicated: Secondary | ICD-10-CM | POA: Diagnosis not present

## 2017-11-16 DIAGNOSIS — M25511 Pain in right shoulder: Secondary | ICD-10-CM | POA: Diagnosis not present

## 2017-11-16 DIAGNOSIS — Y929 Unspecified place or not applicable: Secondary | ICD-10-CM | POA: Insufficient documentation

## 2017-11-16 DIAGNOSIS — Y939 Activity, unspecified: Secondary | ICD-10-CM | POA: Insufficient documentation

## 2017-11-16 DIAGNOSIS — Z041 Encounter for examination and observation following transport accident: Secondary | ICD-10-CM | POA: Diagnosis present

## 2017-11-16 MED ORDER — CYCLOBENZAPRINE HCL 10 MG PO TABS: 10.0000 mg | ORAL_TABLET | Freq: Two times a day (BID) | ORAL | 0 refills | Status: DC | PRN

## 2017-11-16 MED ORDER — MELOXICAM 7.5 MG PO TABS: 7.5000 mg | ORAL_TABLET | Freq: Every day | ORAL | 0 refills | Status: AC
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
## 2017-11-16 NOTE — Discharge Instructions (Addendum)
Home to rest. Take Flexeril and Meloxicam as needed as prescribed for pain. Alternate hot/cold compresses to area for 20 minutes at a time.

## 2017-12-30 ENCOUNTER — Ambulatory Visit: Payer: No Typology Code available for payment source | Admitting: Family Medicine

## 2017-12-30 DIAGNOSIS — Z0289 Encounter for other administrative examinations: Secondary | ICD-10-CM

## 2018-03-27 ENCOUNTER — Other Ambulatory Visit: Payer: Self-pay | Admitting: Family Medicine

## 2018-04-04 ENCOUNTER — Inpatient Hospital Stay (HOSPITAL_COMMUNITY)
Admission: AD | Admit: 2018-04-04 | Discharge: 2018-04-04 | Disposition: A | Discharge status: Home / Self Care | Payer: No Typology Code available for payment source | Source: Ambulatory Visit | Attending: Obstetrics & Gynecology | Admitting: Obstetrics & Gynecology

## 2018-04-04 DIAGNOSIS — B9689 Other specified bacterial agents as the cause of diseases classified elsewhere: Secondary | ICD-10-CM | POA: Diagnosis not present

## 2018-04-04 DIAGNOSIS — N760 Acute vaginitis: Secondary | ICD-10-CM | POA: Diagnosis not present

## 2018-04-04 DIAGNOSIS — R109 Unspecified abdominal pain: Secondary | ICD-10-CM | POA: Insufficient documentation

## 2018-04-04 DIAGNOSIS — F17200 Nicotine dependence, unspecified, uncomplicated: Secondary | ICD-10-CM | POA: Insufficient documentation

## 2018-04-04 LAB — URINALYSIS, ROUTINE W REFLEX MICROSCOPIC
BACTERIA UA: NONE SEEN
BILIRUBIN URINE: NEGATIVE
Glucose, UA: NEGATIVE mg/dL
HGB URINE DIPSTICK: NEGATIVE
Ketones, ur: NEGATIVE mg/dL
NITRITE: NEGATIVE
PROTEIN: NEGATIVE mg/dL
Specific Gravity, Urine: 1.027 (ref 1.005–1.030)
pH: 6 (ref 5.0–8.0)

## 2018-04-04 LAB — WET PREP, GENITAL
Sperm: NONE SEEN
Trich, Wet Prep: NONE SEEN
YEAST WET PREP: NONE SEEN

## 2018-04-04 LAB — POCT PREGNANCY, URINE: PREG TEST UR: NEGATIVE

## 2018-04-04 MED ORDER — METRONIDAZOLE 500 MG PO TABS: 500.0000 mg | ORAL_TABLET | Freq: Two times a day (BID) | ORAL | 0 refills | Status: DC

## 2018-04-04 NOTE — MAU Note (Signed)
Pt reports she has been having abd cramping on and off for a few weeks. Taking ibuprofen with some relief.Though  Her period was going to come on but it has not.( has irregular periods. LMP aprox 02/17/2018. Pt also c/o white cottage cheese discharge with odor for about 2 weeks also.

## 2018-04-04 NOTE — Discharge Instructions (Signed)

## 2018-04-04 NOTE — MAU Provider Note (Signed)
History     CSN: [id number]  Arrival date and time: 04/04/18 1614   First Provider Initiated Contact with Patient 04/04/18 1815      Chief Complaint  Patient presents with  . Abdominal Pain   HPI  Stacey Hines is a 22 y.o. [id number] who presents to MAU today with complaint of 2.5 weeks of cramping and discharge. She states ibuprofen has helped some today. She denies bleeding. LMP 02/17/18 and history of irregular periods.  OB History    Gravida  0   Para  0   Term  0   Preterm  0   AB  0   Living  0     SAB  0   TAB  0   Ectopic  0   Multiple  0   Live Births              Past Medical History:  Diagnosis Date  . Medical history non-contributory     Past Surgical History:  Procedure Laterality Date  . NO PAST SURGERIES      No family history on file.  Social History   Tobacco Use  . Smoking status: Current Some Day Smoker  . Smokeless tobacco: Never Used  Substance Use Topics  . Alcohol use: Yes    Comment: Ocassiona  . Drug use: No    Allergies:  Allergies  Allergen Reactions  . Pineapple Anaphylaxis and Swelling    Mouth swells    No medications prior to admission.    Review of Systems  Constitutional: Negative for fever.  Gastrointestinal: Positive for abdominal pain. Negative for constipation, diarrhea, nausea and vomiting.  Genitourinary: Positive for vaginal discharge. Negative for dysuria, frequency, urgency and vaginal bleeding.   Physical Exam   Blood pressure 124/76, pulse 83, temperature 98.2 F (36.8 C), resp. rate 18, height 5\' 3"  (1.6 m), weight 73.9 kg, last menstrual period 02/17/2018.  Physical Exam  Nursing note and vitals reviewed. Constitutional: She is oriented to person, place, and time. She appears well-developed and well-nourished. No distress.  HENT:  Head: Normocephalic and atraumatic.  Cardiovascular: Normal rate.  Respiratory: Effort normal.  GI: Soft. She exhibits no distension.   Neurological: She is alert and oriented to person, place, and time.  Skin: Skin is warm and dry. No erythema.  Psychiatric: She has a normal mood and affect.   Results for orders placed or performed during the hospital encounter of 04/04/18 (from the past 24 hour(s))  Urinalysis, Routine w reflex microscopic     Status: Abnormal   Collection Time: 04/04/18  4:35 PM  Result Value Ref Range   Color, Urine YELLOW YELLOW   APPearance HAZY (A) CLEAR   Specific Gravity, Urine 1.027 1.005 - 1.030   pH 6.0 5.0 - 8.0   Glucose, UA NEGATIVE NEGATIVE mg/dL   Hgb urine dipstick NEGATIVE NEGATIVE   Bilirubin Urine NEGATIVE NEGATIVE   Ketones, ur NEGATIVE NEGATIVE mg/dL   Protein, ur NEGATIVE NEGATIVE mg/dL   Nitrite NEGATIVE NEGATIVE   Leukocytes, UA TRACE (A) NEGATIVE   RBC / HPF 0-5 0 - 5 RBC/hpf   WBC, UA 6-10 0 - 5 WBC/hpf   Bacteria, UA NONE SEEN NONE SEEN   Squamous Epithelial / LPF 0-5 0 - 5   Mucus PRESENT   Pregnancy, urine POC     Status: None   Collection Time: 04/04/18  4:38 PM  Result Value Ref Range   Preg Test, Ur NEGATIVE NEGATIVE  Wet  prep, genital     Status: Abnormal   Collection Time: 04/04/18  5:30 PM  Result Value Ref Range   Yeast Wet Prep HPF POC NONE SEEN NONE SEEN   Trich, Wet Prep NONE SEEN NONE SEEN   Clue Cells Wet Prep HPF POC PRESENT (A) NONE SEEN   WBC, Wet Prep HPF POC MANY (A) NONE SEEN   Sperm NONE SEEN     MAU Course  Procedures None  MDM UPT - negative UA, wet prep and GC/Chlamydia today Patient instructed to self swab by RN  Assessment and Plan  A: Bacterial vaginosis   P:  Discharge home Rx for Flagyl sent to patient's pharmacy  Warning signs for worsening condition discussed Patient advised to follow-up with [facility] as needed for other GYN concerns Patient may return to MAU as needed or if her condition were to change or worsen  Vonzella NippleJulie Wenzel, PA-C 04/04/2018, 8:53 PM

## 2018-04-07 LAB — GC/CHLAMYDIA PROBE AMP (~~LOC~~) NOT AT ARMC
Chlamydia: POSITIVE — AB
NEISSERIA GONORRHEA: POSITIVE — AB

## 2018-04-08 ENCOUNTER — Telehealth: Payer: Self-pay | Admitting: Advanced Practice Midwife

## 2018-04-08 NOTE — Telephone Encounter (Signed)
Pt called to get her test results from MAU visit 05/05/18. She reports she missed a phone call and thought it might be about test results. Discussed pt positive test for chlamydia and gonorrhea. Pt encouraged to go to [facility] for treatment. Partner(s) should be treated as well. Pt states understanding.

## 2019-07-20 ENCOUNTER — Other Ambulatory Visit: Payer: Self-pay

## 2019-07-20 ENCOUNTER — Encounter (HOSPITAL_COMMUNITY): Payer: Self-pay | Admitting: Emergency Medicine

## 2019-07-20 ENCOUNTER — Emergency Department (HOSPITAL_COMMUNITY)
Admission: EM | Admit: 2019-07-20 | Discharge: 2019-07-20 | Disposition: A | Discharge status: Home / Self Care | Payer: HRSA Program | Attending: Emergency Medicine | Admitting: Emergency Medicine

## 2019-07-20 DIAGNOSIS — F17200 Nicotine dependence, unspecified, uncomplicated: Secondary | ICD-10-CM | POA: Diagnosis not present

## 2019-07-20 DIAGNOSIS — U071 COVID-19: Secondary | ICD-10-CM | POA: Insufficient documentation

## 2019-07-20 DIAGNOSIS — J069 Acute upper respiratory infection, unspecified: Secondary | ICD-10-CM

## 2019-07-20 DIAGNOSIS — R05 Cough: Secondary | ICD-10-CM | POA: Diagnosis present

## 2019-07-20 DIAGNOSIS — R509 Fever, unspecified: Secondary | ICD-10-CM | POA: Diagnosis not present

## 2019-07-20 DIAGNOSIS — M7918 Myalgia, other site: Secondary | ICD-10-CM | POA: Diagnosis not present

## 2019-07-20 LAB — SARS CORONAVIRUS 2 (TAT 6-24 HRS): SARS Coronavirus 2: POSITIVE — AB

## 2019-07-20 NOTE — ED Provider Notes (Signed)
[facility] EMERGENCY DEPARTMENT Provider Note   CSN: [id number] Arrival date & time: 07/20/19  0815     History Chief Complaint  Patient presents with  . Cough    Wants Covid Test    Stacey Hines is a 24 y.o. female.  24 year old female presents with complaint of cough, congestion, body aches onset 2 days ago, concern for COVID-19 infection.  Patient denies history of asthma or chronic lung disease, shortness of breath.  Reports subjective fever, no known sick contacts.  No other complaints or concerns today.  Stacey Hines was evaluated in Emergency Department on 07/20/2019 for the symptoms described in the history of present illness. She was evaluated in the context of the global COVID-19 pandemic, which necessitated consideration that the patient might be at risk for infection with the SARS-CoV-2 virus that causes COVID-19. Institutional protocols and algorithms that pertain to the evaluation of patients at risk for COVID-19 are in a state of rapid change based on information released by regulatory bodies including the CDC and federal and state organizations. These policies and algorithms were followed during the patient's care in the ED.         Past Medical History:  Diagnosis Date  . Medical history non-contributory     Patient Active Problem List   Diagnosis Date Noted  . Vaginitis and vulvovaginitis, unspecified 06/27/2013    Past Surgical History:  Procedure Laterality Date  . NO PAST SURGERIES       OB History    Gravida  0   Para  0   Term  0   Preterm  0   AB  0   Living  0     SAB  0   TAB  0   Ectopic  0   Multiple  0   Live Births              No family history on file.  Social History   Tobacco Use  . Smoking status: Current Some Day Smoker  . Smokeless tobacco: Never Used  Substance Use Topics  . Alcohol use: Yes    Comment: Ocassiona  . Drug use: No    Home Medications Prior to  Admission medications   Medication Sig Start Date End Date Taking? Authorizing Provider  cyclobenzaprine (FLEXERIL) 10 MG tablet Take 1 tablet (10 mg total) by mouth 2 (two) times daily as needed for muscle spasms. 11/16/17   Jeannie Fend, PA-C  metroNIDAZOLE (FLAGYL) 500 MG tablet Take 1 tablet (500 mg total) by mouth 2 (two) times daily. 04/04/18   Marny Lowenstein, PA-C    Allergies    Pineapple  Review of Systems   Review of Systems  Constitutional: Positive for fever. Negative for chills and diaphoresis.  HENT: Positive for congestion. Negative for rhinorrhea, sinus pressure, sinus pain, sneezing and sore throat.   Respiratory: Positive for cough. Negative for shortness of breath.   Gastrointestinal: Negative for diarrhea, nausea and vomiting.  Musculoskeletal: Positive for arthralgias and myalgias. Negative for joint swelling.  Skin: Negative for rash and wound.  Allergic/Immunologic: Negative for immunocompromised state.  Neurological: Negative for weakness.  Hematological: Negative for adenopathy.  Psychiatric/Behavioral: Negative for confusion.  All other systems reviewed and are negative.   Physical Exam Updated Vital Signs BP 123/81 (BP Location: Left Arm)   Pulse 96   Temp 98.6 F (37 C) (Oral)   Resp 16   Ht 5\' 4"  (1.626 m)   Wt  72.6 kg   LMP 07/06/2019   SpO2 100%   BMI 27.46 kg/m   Physical Exam Vitals and nursing note reviewed.  Constitutional:      General: She is not in acute distress.    Appearance: She is well-developed. She is not diaphoretic.  HENT:     Head: Normocephalic and atraumatic.  Cardiovascular:     Rate and Rhythm: Normal rate and regular rhythm.     Pulses: Normal pulses.     Heart sounds: Normal heart sounds.  Pulmonary:     Effort: Pulmonary effort is normal.     Breath sounds: Normal breath sounds.  Musculoskeletal:     Right lower leg: No edema.     Left lower leg: No edema.  Skin:    General: Skin is warm and dry.      Findings: No erythema or rash.  Neurological:     Mental Status: She is alert and oriented to person, place, and time.  Psychiatric:        Behavior: Behavior normal.     ED Results / Procedures / Treatments   Labs (all labs ordered are listed, but only abnormal results are displayed) Labs Reviewed  SARS CORONAVIRUS 2 (TAT 6-24 HRS)    EKG None  Radiology No results found.  Procedures Procedures (including critical care time)  Medications Ordered in ED Medications - No data to display  ED Course  I have reviewed the triage vital signs and the nursing notes.  Pertinent labs & imaging results that were available during my care of the patient were reviewed by me and considered in my medical decision making (see chart for details).  Clinical Course as of Jul 20 934  Fri Apr 16, 759  1644 24 year old female with URI symptoms x2 days, requesting Covid testing.  Patient is otherwise well-appearing, denies loss of sense of smell or taste.  Vitals are reassuring, does not require supplemental oxygen at this time.  Advised patient to quarantine pending her test results.  Recommend symptomatic treatment at this time.   [LM]    Clinical Course User Index [LM] Roque Lias   MDM Rules/Calculators/A&P                      Final Clinical Impression(s) / ED Diagnoses Final diagnoses:  Viral URI with cough    Rx / DC Orders ED Discharge Orders    None       Tacy Learn, PA-C 07/20/19 2694    Quintella Reichert, MD 07/20/19 [phone]

## 2019-07-20 NOTE — ED Triage Notes (Signed)
Patient requesting to be tested for Covid. C/o generalized body aches, nausea, and night sweats onset of Wednesday. Denies any known contacts with Covid.

## 2019-07-20 NOTE — Discharge Instructions (Addendum)
Home to quarantine pending test results.  You can take symptomatic medications for your symptoms such as Zyrtec, Flonase, Delsym.

## 2019-07-22 ENCOUNTER — Telehealth: Payer: Self-pay

## 2019-07-22 NOTE — Telephone Encounter (Signed)
Pt notified of positive COVID-19 test results. Pt verbalized understanding. Pt reports that they are feeling better.Pt advised to remain in self quarantine until at least 10 days since symptom onset And 3 consecutive days fever free without antipyretics And improvement in respiratory symptoms. Patient advised to utilize over the counter medications to treat symptoms. Pt advised to seek treatment in the ED if respiratory issues/distress develops.Pt advised they should only leave home to seek and medical care and must wear a mask in public. Pt instructed to limit contact with family members or caregivers in the home. Pt advised to practice social distancing and to continue to use good preventative care measures such has frequent hand washing, staying out of crowds and cleaning hard surfaces frequently touched in the home.Pt informed that the health department will likely follow up and may have additional recommendations. Will notify [place]. °

## 2019-07-24 ENCOUNTER — Encounter (HOSPITAL_COMMUNITY): Payer: Self-pay | Admitting: Emergency Medicine

## 2019-07-24 ENCOUNTER — Emergency Department (HOSPITAL_COMMUNITY): Payer: Self-pay

## 2019-07-24 ENCOUNTER — Other Ambulatory Visit: Payer: Self-pay

## 2019-07-24 ENCOUNTER — Emergency Department (HOSPITAL_COMMUNITY)
Admission: EM | Admit: 2019-07-24 | Discharge: 2019-07-24 | Disposition: A | Discharge status: Home / Self Care | Payer: Self-pay | Attending: Emergency Medicine | Admitting: Emergency Medicine

## 2019-07-24 DIAGNOSIS — R0602 Shortness of breath: Secondary | ICD-10-CM | POA: Insufficient documentation

## 2019-07-24 DIAGNOSIS — Z5321 Procedure and treatment not carried out due to patient leaving prior to being seen by health care provider: Secondary | ICD-10-CM | POA: Insufficient documentation

## 2019-07-24 LAB — CBC WITH DIFFERENTIAL/PLATELET
Abs Immature Granulocytes: 0.01 10*3/uL (ref 0.00–0.07)
Basophils Absolute: 0 10*3/uL (ref 0.0–0.1)
Basophils Relative: 0 %
Eosinophils Absolute: 0.1 10*3/uL (ref 0.0–0.5)
Eosinophils Relative: 1 %
HCT: 44.6 % (ref 36.0–46.0)
Hemoglobin: 14.7 g/dL (ref 12.0–15.0)
Immature Granulocytes: 0 %
Lymphocytes Relative: 38 %
Lymphs Abs: 2.9 10*3/uL (ref 0.7–4.0)
MCH: 29.5 pg (ref 26.0–34.0)
MCHC: 33 g/dL (ref 30.0–36.0)
MCV: 89.6 fL (ref 80.0–100.0)
Monocytes Absolute: 0.5 10*3/uL (ref 0.1–1.0)
Monocytes Relative: 6 %
Neutro Abs: 4.2 10*3/uL (ref 1.7–7.7)
Neutrophils Relative %: 55 %
Platelets: 280 10*3/uL (ref 150–400)
RBC: 4.98 MIL/uL (ref 3.87–5.11)
RDW: 12.6 % (ref 11.5–15.5)
WBC: 7.5 10*3/uL (ref 4.0–10.5)
nRBC: 0 % (ref 0.0–0.2)

## 2019-07-24 LAB — COMPREHENSIVE METABOLIC PANEL
ALT: 17 U/L (ref 0–44)
AST: 22 U/L (ref 15–41)
Albumin: 4 g/dL (ref 3.5–5.0)
Alkaline Phosphatase: 78 U/L (ref 38–126)
Anion gap: 9 (ref 5–15)
BUN: 7 mg/dL (ref 6–20)
CO2: 27 mmol/L (ref 22–32)
Calcium: 9.5 mg/dL (ref 8.9–10.3)
Chloride: 104 mmol/L (ref 98–111)
Creatinine, Ser: 0.78 mg/dL (ref 0.44–1.00)
GFR calc Af Amer: >60 mL/min (ref >60)
GFR calc non Af Amer: >60 mL/min (ref >60)
Glucose, Bld: 95 mg/dL (ref 70–99)
Potassium: 3.4 mmol/L — ABNORMAL LOW (ref 3.5–5.1)
Sodium: 140 mmol/L (ref 135–145)
Total Bilirubin: 0.7 mg/dL (ref 0.3–1.2)
Total Protein: 8.1 g/dL (ref 6.5–8.1)

## 2019-07-24 LAB — I-STAT BETA HCG BLOOD, ED (MC, WL, AP ONLY): I-stat hCG, quantitative: <5 m[IU]/mL (ref <5)

## 2019-07-24 NOTE — ED Notes (Signed)
Pt left the department

## 2019-07-24 NOTE — ED Triage Notes (Signed)
Pt tested positive for Covid 4 days ago, she is having increase SOB and abd pain today.

## 2020-02-11 ENCOUNTER — Other Ambulatory Visit: Payer: Self-pay

## 2020-02-11 ENCOUNTER — Encounter (HOSPITAL_COMMUNITY): Payer: Self-pay

## 2020-02-11 ENCOUNTER — Ambulatory Visit (HOSPITAL_COMMUNITY)
Admission: EM | Admit: 2020-02-11 | Discharge: 2020-02-11 | Disposition: A | Discharge status: Home / Self Care | Payer: Self-pay | Attending: Family Medicine | Admitting: Family Medicine

## 2020-02-11 DIAGNOSIS — N760 Acute vaginitis: Secondary | ICD-10-CM | POA: Diagnosis not present

## 2020-02-11 LAB — POCT URINALYSIS DIPSTICK, ED / UC
Bilirubin Urine: NEGATIVE
Glucose, UA: NEGATIVE mg/dL
Hgb urine dipstick: NEGATIVE
Ketones, ur: 15 mg/dL — AB
Leukocytes,Ua: NEGATIVE
Nitrite: NEGATIVE
Protein, ur: NEGATIVE mg/dL
Specific Gravity, Urine: >=1.03 (ref 1.005–1.030)
Urobilinogen, UA: 0.2 mg/dL (ref 0.0–1.0)
pH: 6.5 (ref 5.0–8.0)

## 2020-02-11 LAB — POC URINE PREG, ED: Preg Test, Ur: NEGATIVE

## 2020-02-11 MED ORDER — METRONIDAZOLE 500 MG PO TABS: 500.0000 mg | ORAL_TABLET | Freq: Two times a day (BID) | ORAL | 0 refills | Status: DC

## 2020-02-11 MED ORDER — FLUCONAZOLE 150 MG PO TABS: 150.0000 mg | ORAL_TABLET | ORAL | 0 refills | Status: AC

## 2020-02-11 NOTE — ED Triage Notes (Signed)
Pt presents with lower abdominal pain, vaginal discharge, and odor for past few days.

## 2020-02-11 NOTE — ED Provider Notes (Signed)
[facility]    CSN: [id number] Arrival date & time: 02/11/20  1343      History   Chief Complaint Chief Complaint  Patient presents with  . Vaginitis    HPI Stacey Hines is a 24 y.o. female.   Subjective:   Stacey Hines is a 24 y.o. female who presents for evaluation of vaginal itching, vaginal discharge and low back pain. The vaginal discharge is described as white, thick and malodorous. Symptoms also include local irritation. Symptoms have been present for 2 days. Menstrual pattern: bleeding regularly. LMP was about 3 weeks ago. She is sexually active with one female partner. She is not on birth control. She denies any risk of STD exposure but would like to be tested. No fevers, nausea, vomiting, dysuria or flank pain.   The following portions of the patient's history were reviewed and updated as appropriate: allergies, current medications, past family history, past medical history, past social history, past surgical history and problem list.       Past Medical History:  Diagnosis Date  . Medical history non-contributory     Patient Active Problem List   Diagnosis Date Noted  . Vaginitis and vulvovaginitis, unspecified 06/27/2013    Past Surgical History:  Procedure Laterality Date  . NO PAST SURGERIES      OB History    Gravida  0   Para  0   Term  0   Preterm  0   AB  0   Living  0     SAB  0   TAB  0   Ectopic  0   Multiple  0   Live Births               Home Medications    Prior to Admission medications   Medication Sig Start Date End Date Taking? Authorizing Provider  fluconazole (DIFLUCAN) 150 MG tablet Take 1 tablet (150 mg total) by mouth every 3 (three) days for 2 doses. 02/11/20 02/15/20  Lurline Idol, FNP  metroNIDAZOLE (FLAGYL) 500 MG tablet Take 1 tablet (500 mg total) by mouth 2 (two) times daily. 02/11/20   Lurline Idol, FNP    Family History History reviewed. No pertinent family  history.  Social History Social History   Tobacco Use  . Smoking status: Current Some Day Smoker  . Smokeless tobacco: Never Used  Substance Use Topics  . Alcohol use: Yes    Comment: Ocassiona  . Drug use: No     Allergies   Pineapple   Review of Systems Review of Systems  Constitutional: Negative for fever.  Gastrointestinal: Negative for nausea and vomiting.  Genitourinary: Positive for vaginal discharge. Negative for dysuria, flank pain and frequency.  Musculoskeletal: Positive for back pain.  All other systems reviewed and are negative.    Physical Exam Triage Vital Signs ED Triage Vitals  Enc Vitals Group     BP 02/11/20 1529 119/70     Pulse Rate 02/11/20 1529 71     Resp 02/11/20 1529 18     Temp 02/11/20 1529 98.5 F (36.9 C)     Temp Source 02/11/20 1529 Oral     SpO2 02/11/20 1529 100 %     Weight --      Height --      Head Circumference --      Peak Flow --      Pain Score 02/11/20 1527 5     Pain Loc --  Pain Edu? --      Excl. in GC? --    No data found.  Updated Vital Signs BP 119/70 (BP Location: Right Arm)   Pulse 71   Temp 98.5 F (36.9 C) (Oral)   Resp 18   LMP 01/29/2020   SpO2 100%   Visual Acuity Right Eye Distance:   Left Eye Distance:   Bilateral Distance:    Right Eye Near:   Left Eye Near:    Bilateral Near:     Physical Exam Vitals reviewed.  Constitutional:      Appearance: Normal appearance.  HENT:     Head: Normocephalic.  Cardiovascular:     Rate and Rhythm: Normal rate and regular rhythm.  Pulmonary:     Effort: Pulmonary effort is normal.     Breath sounds: Normal breath sounds.  Abdominal:     General: Bowel sounds are normal.     Palpations: Abdomen is soft.     Tenderness: There is no abdominal tenderness.  Musculoskeletal:        General: Normal range of motion.     Cervical back: Normal range of motion and neck supple.  Skin:    General: Skin is warm and dry.  Neurological:      General: No focal deficit present.     Mental Status: She is alert and oriented to person, place, and time.  Psychiatric:        Mood and Affect: Mood normal.        Behavior: Behavior normal.      UC Treatments / Results  Labs (all labs ordered are listed, but only abnormal results are displayed) Labs Reviewed  POCT URINALYSIS DIPSTICK, ED / UC - Abnormal; Notable for the following components:      Result Value   Ketones, ur 15 (*)    All other components within normal limits  POC URINE PREG, ED  CERVICOVAGINAL ANCILLARY ONLY    EKG   Radiology No results found.  Procedures Procedures (including critical care time)  Medications Ordered in UC Medications - No data to display  Initial Impression / Assessment and Plan / UC Course  I have reviewed the triage vital signs and the nursing notes.  Pertinent labs & imaging results that were available during my care of the patient were reviewed by me and considered in my medical decision making (see chart for details).    24 y.o. female who presents for evaluation of vaginal itching, vaginal discharge, vaginal irritation and low back pain x 2 days. LMP 3 weeks ago. Urine pregnancy negative. UA negative for acute infection. Vaginal swab pending. Will treat with flagyl and diflucan now. Further treatment pending results of cervicovaginal swab results. Patient agreeable. Abstinence from intercourse advised until all testing has resulted and treatment completed. Discussed safe sex.  Today's evaluation has revealed no signs of a dangerous process. Discussed diagnosis with patient and/or guardian. Patient and/or guardian aware of their diagnosis, possible red flag symptoms to watch out for and need for close follow up. Patient and/or guardian understands verbal and written discharge instructions. Patient and/or guardian comfortable with plan and disposition.  Patient and/or guardian has a clear mental status at this time, good insight into  illness (after discussion and teaching) and has clear judgment to make decisions regarding their care  This care was provided during an unprecedented National Emergency due to the Novel Coronavirus (COVID-19) pandemic. COVID-19 infections and transmission risks place heavy strains on healthcare resources.  As this  pandemic evolves, our facility, providers, and staff strive to respond fluidly, to remain operational, and to provide care relative to available resources and information. Outcomes are unpredictable and treatments are without well-defined guidelines. Further, the impact of COVID-19 on all aspects of urgent care, including the impact to patients seeking care for reasons other than COVID-19, is unavoidable during this national emergency. At this time of the global pandemic, management of patients has significantly changed, even for non-COVID positive patients given high local and regional COVID volumes at this time requiring high healthcare system and resource utilization. The standard of care for management of both COVID suspected and non-COVID suspected patients continues to change rapidly at the local, regional, national, and global levels. This patient was worked up and treated to the best available but ever changing evidence and resources available at this current time.   Documentation was completed with the aid of voice recognition software. Transcription may contain typographical errors.  Final Clinical Impressions(s) / UC Diagnoses   Final diagnoses:  Acute vaginitis     Discharge Instructions     Take medications as prescribed. Further treatment may be needed but we will wait until the results of your other test to determine that. No sex until all testing has resulted and treatment completed.     ED Prescriptions    Medication Sig Dispense Auth. Provider   metroNIDAZOLE (FLAGYL) 500 MG tablet Take 1 tablet (500 mg total) by mouth 2 (two) times daily. 14 tablet Lurline Idol,  FNP   fluconazole (DIFLUCAN) 150 MG tablet Take 1 tablet (150 mg total) by mouth every 3 (three) days for 2 doses. 2 tablet Lurline Idol, FNP     PDMP not reviewed this encounter.   Lurline Idol, [place] 02/11/20 1555

## 2020-02-11 NOTE — Discharge Instructions (Addendum)
Take medications as prescribed. Further treatment may be needed but we will wait until the results of your other test to determine that. No sex until all testing has resulted and treatment completed.

## 2020-02-12 LAB — CERVICOVAGINAL ANCILLARY ONLY
Bacterial Vaginitis (gardnerella): POSITIVE — AB
Candida Glabrata: NEGATIVE
Candida Vaginitis: NEGATIVE
Chlamydia: NEGATIVE
Comment: NEGATIVE
Comment: NEGATIVE
Comment: NEGATIVE
Comment: NEGATIVE
Comment: NEGATIVE
Comment: NORMAL
Neisseria Gonorrhea: NEGATIVE
Trichomonas: NEGATIVE

## 2020-12-02 IMAGING — DX DG CHEST 1V PORT
1 series · 1 of 1 positions shown · non-contrast
Comparison: 11/16/2017

CLINICAL DATA: COVID positive.  Shortness of breath

EXAM:
PORTABLE CHEST 1 VIEW

[chest]
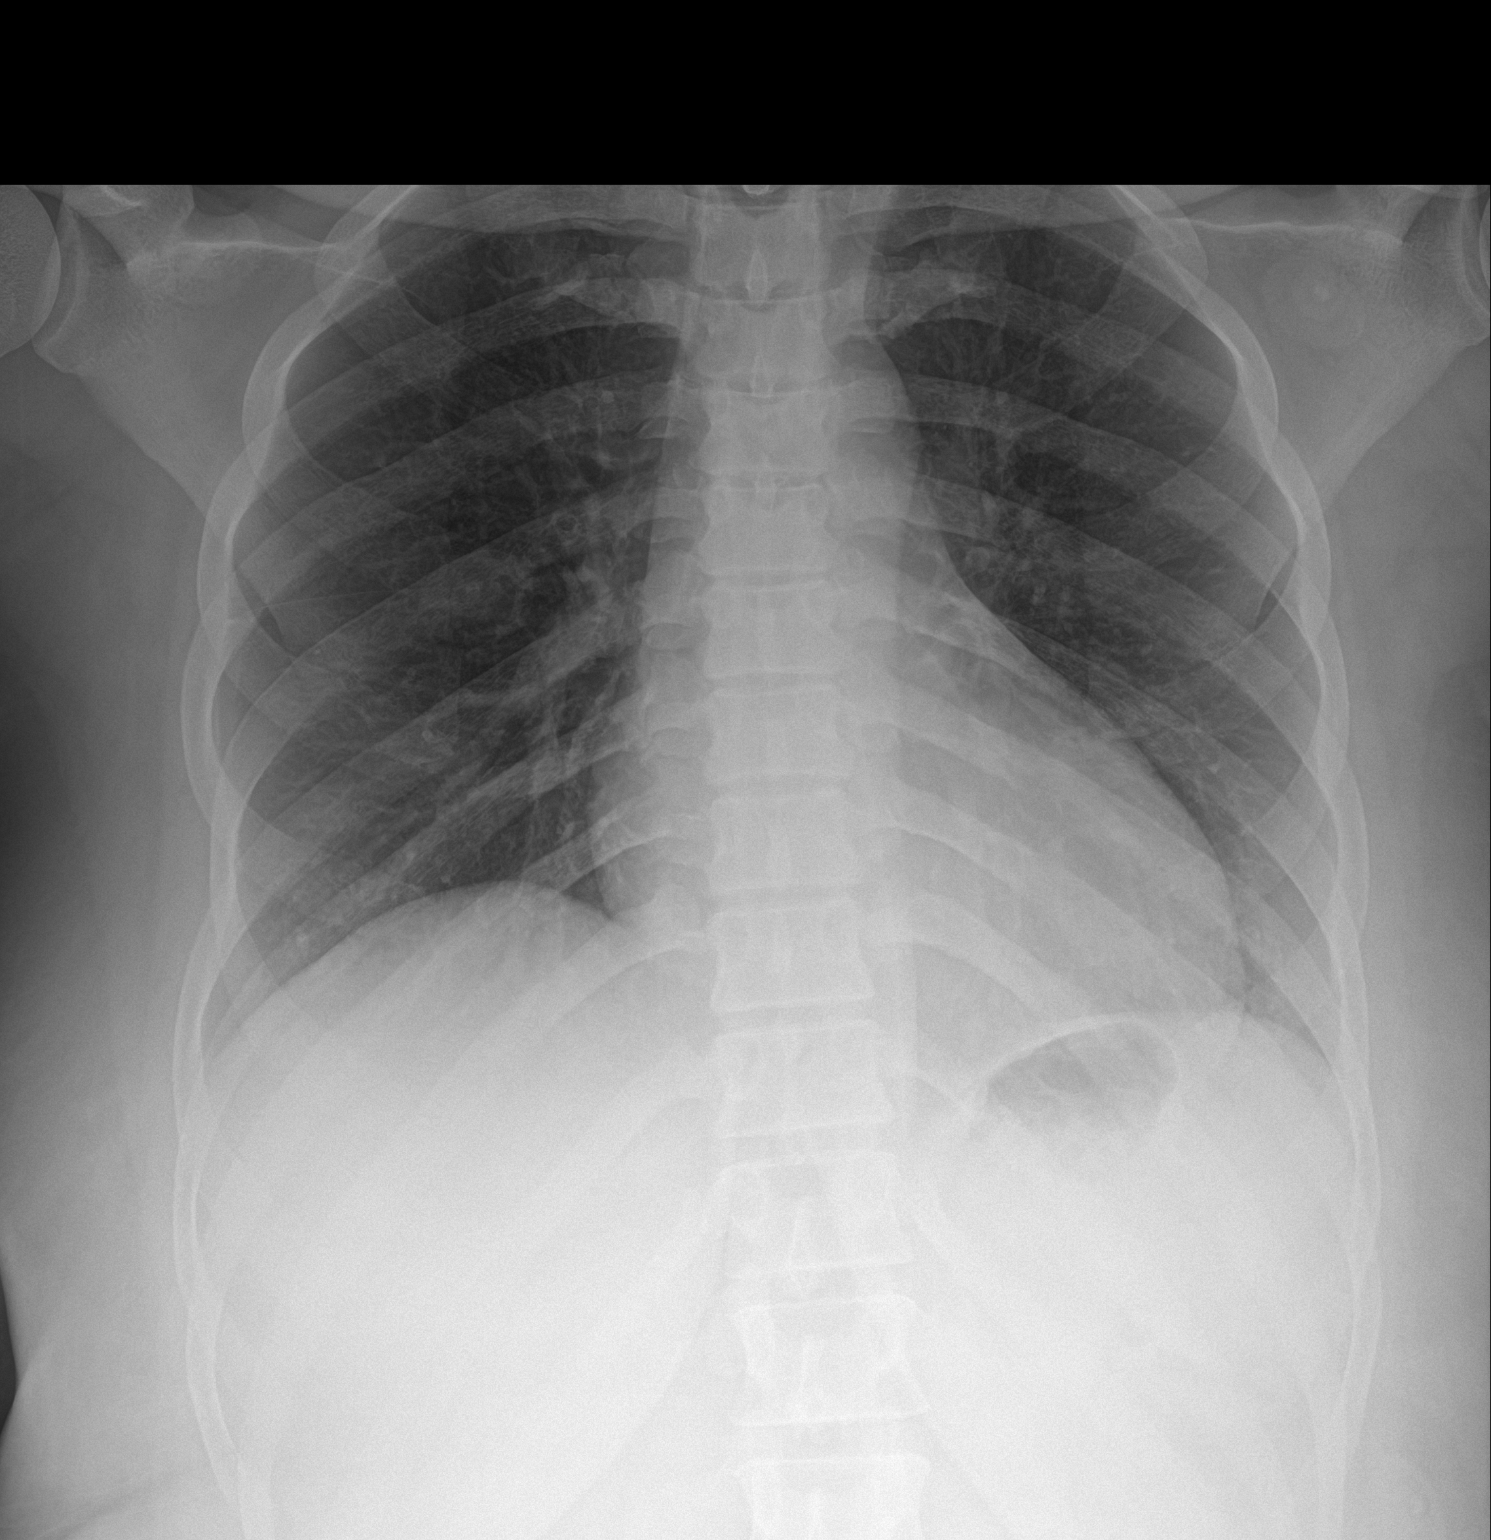

[1 of 1 positions shown; findings below may reference images not displayed]

FINDINGS: Heart and mediastinal contours are within normal limits. No focal
opacities or effusions. No acute bony abnormality.
IMPRESSION: No active disease.

## 2021-06-10 ENCOUNTER — Other Ambulatory Visit: Payer: Self-pay

## 2021-06-10 ENCOUNTER — Ambulatory Visit (HOSPITAL_COMMUNITY)
Admission: EM | Admit: 2021-06-10 | Discharge: 2021-06-10 | Disposition: A | Discharge status: Home / Self Care | Payer: 59 | Attending: Family Medicine | Admitting: Family Medicine

## 2021-06-10 DIAGNOSIS — F4321 Adjustment disorder with depressed mood: Secondary | ICD-10-CM | POA: Insufficient documentation

## 2021-06-10 DIAGNOSIS — B9689 Other specified bacterial agents as the cause of diseases classified elsewhere: Secondary | ICD-10-CM | POA: Diagnosis not present

## 2021-06-10 DIAGNOSIS — N760 Acute vaginitis: Secondary | ICD-10-CM | POA: Insufficient documentation

## 2021-06-10 DIAGNOSIS — N898 Other specified noninflammatory disorders of vagina: Secondary | ICD-10-CM | POA: Diagnosis not present

## 2021-06-10 LAB — SYPHILIS: RPR W/REFLEX TO RPR TITER AND TREPONEMAL ANTIBODIES, TRADITIONAL SCREENING AND DIAGNOSIS ALGORITHM: RPR Ser Ql: NONREACTIVE

## 2021-06-10 LAB — HIV ANTIBODY (ROUTINE TESTING W REFLEX): HIV Screen 4th Generation wRfx: NONREACTIVE

## 2021-06-10 MED ORDER — METRONIDAZOLE 500 MG PO TABS: 500.0000 mg | ORAL_TABLET | Freq: Two times a day (BID) | ORAL | 0 refills | Status: AC

## 2021-06-10 NOTE — Discharge Instructions (Signed)
You were seen today for vaginal discharge.  I have treated you for BV today.  Your lab work and swab will be resulted tomorrow.  If further treatment is needed we will let you know.  ?As far as a grief counselor, you could call [facility] at [facility] at [phone] to see if they can help you make an appointment.   ?

## 2021-06-10 NOTE — ED Provider Notes (Signed)
?[place] ? ? ? ?CSN: [id number] ?Arrival date & time: 06/10/21  0907 ? ? ?  ? ?History   ?Chief Complaint ?Chief Complaint  ?Patient presents with  ? STD Testing  ? ? ?HPI ?Stacey Hines is a 26 y.o. female.  ? ?She is here for vaginal d/c, odor x 6 days.  H/o of BV, and this seems typical for this.  ?No known std exposures.  She has had unprotected intercourse.   ?She would like full blood work today as well.  ?No painful, burning with urination.  ? ?She would like a referral for grief counseling.   Her brother passed away several weeks ago, on her bday, and would like to get grief counseling.  ?She does not have a pcp.   ? ?Past Medical History:  ?Diagnosis Date  ? Medical history non-contributory   ? ? ?Patient Active Problem List  ? Diagnosis Date Noted  ? Vaginitis and vulvovaginitis, unspecified 06/27/2013  ? ? ?Past Surgical History:  ?Procedure Laterality Date  ? NO PAST SURGERIES    ? ? ?OB History   ? ? Gravida  ?0  ? Para  ?0  ? Term  ?0  ? Preterm  ?0  ? AB  ?0  ? Living  ?0  ?  ? ? SAB  ?0  ? IAB  ?0  ? Ectopic  ?0  ? Multiple  ?0  ? Live Births  ?   ?   ?  ?  ? ? ? ?Home Medications   ? ?Prior to Admission medications   ?Medication Sig Start Date End Date Taking? Authorizing Provider  ?metroNIDAZOLE (FLAGYL) 500 MG tablet Take 1 tablet (500 mg total) by mouth 2 (two) times daily. 02/11/20   Enrique Sack, [place]  ? ? ?Family History ?No family history on file. ? ?Social History ?Social History  ? ?Tobacco Use  ? Smoking status: Some Days  ? Smokeless tobacco: Never  ?Substance Use Topics  ? Alcohol use: Yes  ?  Comment: Ocassiona  ? Drug use: No  ? ? ? ?Allergies   ?Pineapple ? ? ?Review of Systems ?Review of Systems  ?Constitutional: Negative.   ?HENT: Negative.    ?Respiratory: Negative.    ?Cardiovascular: Negative.   ?Gastrointestinal: Negative.   ?Psychiatric/Behavioral: Negative.    ? ? ?Physical Exam ?Triage Vital Signs ?ED Triage Vitals  ?Enc Vitals Group  ?   BP 06/10/21 0925  134/84  ?   Pulse Rate 06/10/21 0925 90  ?   Resp 06/10/21 0925 16  ?   Temp 06/10/21 0925 98.7 ?F (37.1 ?C)  ?   Temp Source 06/10/21 0925 Oral  ?   SpO2 06/10/21 0925 97 %  ?   Weight 06/10/21 0926 160 lb 0.9 oz (72.6 kg)  ?   Height 06/10/21 0926 5\' 3"  (1.6 m)  ?   Head Circumference --   ?   Peak Flow --   ?   Pain Score 06/10/21 0925 0  ?   Pain Loc --   ?   Pain Edu? --   ?   Excl. in [place]? --   ? ?No data found. ? ?Updated Vital Signs ?BP 134/84 (BP Location: Right Arm)   Pulse 90   Temp 98.7 ?F (37.1 ?C) (Oral)   Resp 16   Ht 5\' 3"  (1.6 m)   Wt 72.6 kg   SpO2 97%   BMI 28.35 kg/m?  ? ?Visual Acuity ?Right Eye  Distance:   ?Left Eye Distance:   ?Bilateral Distance:   ? ?Right Eye Near:   ?Left Eye Near:    ?Bilateral Near:    ? ?Physical Exam ?Constitutional:   ?   Appearance: Normal appearance.  ?Pulmonary:  ?   Effort: Pulmonary effort is normal.  ?Neurological:  ?   General: No focal deficit present.  ?   Mental Status: She is alert.  ?Psychiatric:     ?   Mood and Affect: Mood normal.     ?   Behavior: Behavior normal.  ? ? ? ?UC Treatments / Results  ?Labs ?(all labs ordered are listed, but only abnormal results are displayed) ?Labs Reviewed  ?CERVICOVAGINAL ANCILLARY ONLY  ? ? ?EKG ? ? ?Radiology ?No results found. ? ?Procedures ?Procedures (including critical care time) ? ?Medications Ordered in UC ?Medications - No data to display ? ?Initial Impression / Assessment and Plan / UC Course  ?I have reviewed the triage vital signs and the nursing notes. ? ?Pertinent labs & imaging results that were available during my care of the patient were reviewed by me and considered in my medical decision making (see chart for details). ? ?  ?Final Clinical Impressions(s) / UC Diagnoses  ? ?Final diagnoses:  ?Vaginal discharge  ?Bacterial vaginosis  ?Grief reaction  ? ? ? ?Discharge Instructions   ? ?  ?You were seen today for vaginal discharge.  I have treated you for BV today.  Your lab work and swab will be  resulted tomorrow.  If further treatment is needed we will let you know.  ?As far as a grief counselor, you could call [place] [facility] at [phone] to see if they can help you make an appointment.   ? ? ? ?ED Prescriptions   ? ? Medication Sig Dispense Auth. Provider  ? metroNIDAZOLE (FLAGYL) 500 MG tablet Take 1 tablet (500 mg total) by mouth 2 (two) times daily. 14 tablet Rondel Oh, MD  ? ?  ? ?PDMP not reviewed this encounter. ?  Rondel Oh, MD ?06/10/21 0945 ? ?

## 2021-06-10 NOTE — ED Triage Notes (Signed)
Pt presents to UC requesting STD testing. Pt reports vaginal discharge and odor x 6 days. Pt also requesting grief counseling.  ?

## 2021-06-11 LAB — CERVICOVAGINAL ANCILLARY ONLY
Bacterial Vaginitis (gardnerella): POSITIVE — AB
Candida Glabrata: NEGATIVE
Candida Vaginitis: NEGATIVE
Chlamydia: NEGATIVE
Comment: NEGATIVE
Comment: NEGATIVE
Comment: NEGATIVE
Comment: NEGATIVE
Comment: NEGATIVE
Comment: NORMAL
Neisseria Gonorrhea: NEGATIVE
Trichomonas: NEGATIVE

## 2021-08-05 ENCOUNTER — Other Ambulatory Visit: Payer: Self-pay

## 2021-08-05 ENCOUNTER — Ambulatory Visit (HOSPITAL_COMMUNITY)
Admission: EM | Admit: 2021-08-05 | Discharge: 2021-08-05 | Disposition: A | Discharge status: Home / Self Care | Payer: 59 | Attending: Emergency Medicine | Admitting: Emergency Medicine

## 2021-08-05 ENCOUNTER — Encounter (HOSPITAL_COMMUNITY): Payer: Self-pay | Admitting: Emergency Medicine

## 2021-08-05 DIAGNOSIS — N3001 Acute cystitis with hematuria: Secondary | ICD-10-CM | POA: Insufficient documentation

## 2021-08-05 LAB — POCT URINALYSIS DIPSTICK, ED / UC
Glucose, UA: 100 mg/dL — AB
Ketones, ur: 15 mg/dL — AB
Nitrite: POSITIVE — AB
Protein, ur: >=300 mg/dL — AB
Specific Gravity, Urine: 1.025 (ref 1.005–1.030)
Urobilinogen, UA: 2 mg/dL — ABNORMAL HIGH (ref 0.0–1.0)
pH: 5.5 (ref 5.0–8.0)

## 2021-08-05 LAB — POC URINE PREG, ED: Preg Test, Ur: NEGATIVE

## 2021-08-05 MED ORDER — NITROFURANTOIN MONOHYD MACRO 100 MG PO CAPS: 100.0000 mg | ORAL_CAPSULE | Freq: Two times a day (BID) | ORAL | 0 refills | Status: AC

## 2021-08-05 NOTE — ED Triage Notes (Signed)
Onset 2 days ago of symptoms.  Patient initially had mild cramping and now urine has blood in it.  Patient has worsened.  low abdominal pain, vaginal pain.  Has had frequency, urgency.   ?

## 2021-08-05 NOTE — Discharge Instructions (Signed)
Your urinalysis shows Stacey Hines blood cells and nitrates which are indicative of infection, your urine will be sent to the lab to determine exactly which bacteria is present, if any changes need to be made to your medications you will be notified  Begin use of Macrobid twice daily for 5 days   You may use over-the-counter Azo to help minimize your symptoms until antibiotic removes bacteria, this medication will turn your urine orange  Increase your fluid intake through use of water  As always practice good hygiene, wiping front to back and avoidance of scented vaginal products to prevent further irritation  If symptoms continue to persist after use of medication or recur please follow-up with urgent care or your primary doctor as needed  

## 2021-08-05 NOTE — ED Provider Notes (Signed)
?[facility] ? ? ? ?CSN: [id number] ?Arrival date & time: 08/05/21  1545 ? ? ?  ? ?History   ?Chief Complaint ?Chief Complaint  ?Patient presents with  ? Urinary Tract Infection  ? ? ?HPI ?Stacey Hines is a 26 y.o. female.  ? ?Patient was presents with urinary frequency, urgency, dysuria and lower abdominal pressure for 1 day, endorses that hematuria began today.  Has not attempted treatment of symptoms.  Denies fever, chills, vaginal symptoms. ? ?Past Medical History:  ?Diagnosis Date  ? Medical history non-contributory   ? ? ?Patient Active Problem List  ? Diagnosis Date Noted  ? Vaginitis and vulvovaginitis, unspecified 06/27/2013  ? ? ?Past Surgical History:  ?Procedure Laterality Date  ? NO PAST SURGERIES    ? ? ?OB History   ? ? Gravida  ?0  ? Para  ?0  ? Term  ?0  ? Preterm  ?0  ? AB  ?0  ? Living  ?0  ?  ? ? SAB  ?0  ? IAB  ?0  ? Ectopic  ?0  ? Multiple  ?0  ? Live Births  ?   ?   ?  ?  ? ? ? ?Home Medications   ? ?Prior to Admission medications   ?Medication Sig Start Date End Date Taking? Authorizing Provider  ?metroNIDAZOLE (FLAGYL) 500 MG tablet Take 1 tablet (500 mg total) by mouth 2 (two) times daily. ?Patient not taking: Reported on 08/05/2021 06/10/21   Jannifer Franklin, MD  ? ? ?Family History ?History reviewed. No pertinent family history. ? ?Social History ?Social History  ? ?Tobacco Use  ? Smoking status: Some Days  ?  Types: Cigarettes  ? Smokeless tobacco: Never  ?Vaping Use  ? Vaping Use: Some days  ?Substance Use Topics  ? Alcohol use: Yes  ?  Comment: Ocassiona  ? Drug use: No  ? ? ? ?Allergies   ?Pineapple ? ? ?Review of Systems ?Review of Systems  ?Constitutional: Negative.   ?Respiratory: Negative.    ?Cardiovascular: Negative.   ?Gastrointestinal: Negative.   ?Genitourinary:  Positive for dysuria, frequency, hematuria, pelvic pain and urgency. Negative for decreased urine volume, difficulty urinating, dyspareunia, enuresis, flank pain, genital sores, menstrual problem, vaginal  bleeding, vaginal discharge and vaginal pain.  ?Skin: Negative.   ? ? ?Physical Exam ?Triage Vital Signs ?ED Triage Vitals  ?Enc Vitals Group  ?   BP 08/05/21 1631 139/90  ?   Pulse Rate 08/05/21 1631 88  ?   Resp 08/05/21 1631 18  ?   Temp 08/05/21 1631 98.5 ?F (36.9 ?C)  ?   Temp Source 08/05/21 1631 Oral  ?   SpO2 08/05/21 1631 98 %  ?   Weight --   ?   Height --   ?   Head Circumference --   ?   Peak Flow --   ?   Pain Score 08/05/21 1629 7  ?   Pain Loc --   ?   Pain Edu? --   ?   Excl. in [place]? --   ? ?No data found. ? ?Updated Vital Signs ?BP 139/90 (BP Location: Left Arm)   Pulse 88   Temp 98.5 ?F (36.9 ?C) (Oral)   Resp 18   LMP 07/24/2021   SpO2 98%  ? ?Visual Acuity ?Right Eye Distance:   ?Left Eye Distance:   ?Bilateral Distance:   ? ?Right Eye Near:   ?Left Eye Near:    ?Bilateral Near:    ? ?  Physical Exam ?Constitutional:   ?   Appearance: Normal appearance.  ?HENT:  ?   Head: Normocephalic.  ?Eyes:  ?   Extraocular Movements: Extraocular movements intact.  ?Pulmonary:  ?   Effort: Pulmonary effort is normal.  ?Abdominal:  ?   General: Abdomen is flat. Bowel sounds are normal.  ?   Palpations: Abdomen is soft.  ?   Tenderness: There is abdominal tenderness in the suprapubic area. There is no right CVA tenderness or left CVA tenderness.  ?Skin: ?   General: Skin is warm and dry.  ?Neurological:  ?   Mental Status: She is alert and oriented to person, place, and time. Mental status is at baseline.  ?Psychiatric:     ?   Mood and Affect: Mood normal.     ?   Behavior: Behavior normal.  ? ? ? ?UC Treatments / Results  ?Labs ?(all labs ordered are listed, but only abnormal results are displayed) ?Labs Reviewed - No data to display ? ?EKG ? ? ?Radiology ?No results found. ? ?Procedures ?Procedures (including critical care time) ? ?Medications Ordered in UC ?Medications - No data to display ? ?Initial Impression / Assessment and Plan / UC Course  ?I have reviewed the triage vital signs and the nursing  notes. ? ?Pertinent labs & imaging results that were available during my care of the patient were reviewed by me and considered in my medical decision making (see chart for details). ? ?Acute cystitis with hematuria ? ?Urinalysis showing Stacey Hines blood cells and nitrates, will attempt to send for culture however patient has given a very small sample, discussed with patient, Macrobid 5-day course prescribed, recommended over-the-counter Azo to help minimize system, recommend increase fluid intake through use of water and good feminine hygiene for additional supportive care, given short precautions for symptoms persisting past use of medication or reoccurrence to follow-up with urgent care as needed ?Final Clinical Impressions(s) / UC Diagnoses  ? ?Final diagnoses:  ?None  ? ?Discharge Instructions   ?None ?  ? ?ED Prescriptions   ?None ?  ? ?PDMP not reviewed this encounter. ?  ?Valinda Hoar, NP ?08/05/21 1705 ? ?

## 2021-08-08 LAB — URINE CULTURE: Culture: >=100000 — AB

## 2022-12-05 DIAGNOSIS — Z419 Encounter for procedure for purposes other than remedying health state, unspecified: Secondary | ICD-10-CM | POA: Diagnosis not present

## 2023-01-04 DIAGNOSIS — Z419 Encounter for procedure for purposes other than remedying health state, unspecified: Secondary | ICD-10-CM | POA: Diagnosis not present

## 2023-02-04 DIAGNOSIS — Z419 Encounter for procedure for purposes other than remedying health state, unspecified: Secondary | ICD-10-CM | POA: Diagnosis not present

## 2023-03-06 DIAGNOSIS — Z419 Encounter for procedure for purposes other than remedying health state, unspecified: Secondary | ICD-10-CM | POA: Diagnosis not present

## 2023-04-06 DIAGNOSIS — Z419 Encounter for procedure for purposes other than remedying health state, unspecified: Secondary | ICD-10-CM | POA: Diagnosis not present

## 2023-05-07 DIAGNOSIS — Z419 Encounter for procedure for purposes other than remedying health state, unspecified: Secondary | ICD-10-CM | POA: Diagnosis not present

## 2023-06-04 DIAGNOSIS — Z419 Encounter for procedure for purposes other than remedying health state, unspecified: Secondary | ICD-10-CM | POA: Diagnosis not present

## 2023-07-16 DIAGNOSIS — Z419 Encounter for procedure for purposes other than remedying health state, unspecified: Secondary | ICD-10-CM | POA: Diagnosis not present

## 2023-08-15 DIAGNOSIS — Z419 Encounter for procedure for purposes other than remedying health state, unspecified: Secondary | ICD-10-CM | POA: Diagnosis not present

## 2023-09-15 DIAGNOSIS — Z419 Encounter for procedure for purposes other than remedying health state, unspecified: Secondary | ICD-10-CM | POA: Diagnosis not present

## 2023-10-15 DIAGNOSIS — Z419 Encounter for procedure for purposes other than remedying health state, unspecified: Secondary | ICD-10-CM | POA: Diagnosis not present

## 2023-11-15 DIAGNOSIS — Z419 Encounter for procedure for purposes other than remedying health state, unspecified: Secondary | ICD-10-CM | POA: Diagnosis not present

## 2023-12-16 DIAGNOSIS — Z419 Encounter for procedure for purposes other than remedying health state, unspecified: Secondary | ICD-10-CM | POA: Diagnosis not present

## 2024-03-16 DIAGNOSIS — Z419 Encounter for procedure for purposes other than remedying health state, unspecified: Secondary | ICD-10-CM | POA: Diagnosis not present
